# Patient Record
Sex: Female | Born: 1960 | Race: White | Hispanic: No | Smoking: Never smoker
Health system: Southern US, Community
[De-identification: ages and names within clinical notes are randomized; demographics above are authoritative.]

## PROBLEM LIST (undated history)

## (undated) DIAGNOSIS — F32A Depression, unspecified: Secondary | ICD-10-CM

## (undated) DIAGNOSIS — G3184 Mild cognitive impairment, so stated: Secondary | ICD-10-CM

## (undated) DIAGNOSIS — D649 Anemia, unspecified: Secondary | ICD-10-CM

## (undated) DIAGNOSIS — F329 Major depressive disorder, single episode, unspecified: Secondary | ICD-10-CM

## (undated) DIAGNOSIS — M75101 Unspecified rotator cuff tear or rupture of right shoulder, not specified as traumatic: Secondary | ICD-10-CM

## (undated) DIAGNOSIS — R112 Nausea with vomiting, unspecified: Secondary | ICD-10-CM

## (undated) DIAGNOSIS — Z9889 Other specified postprocedural states: Secondary | ICD-10-CM

## (undated) DIAGNOSIS — E7800 Pure hypercholesterolemia, unspecified: Secondary | ICD-10-CM

## (undated) HISTORY — PX: TONSILLECTOMY: SUR1361

## (undated) HISTORY — PX: CHOLECYSTECTOMY: SHX55

## (undated) HISTORY — PX: ENDOMETRIAL ABLATION: SHX621

---

## 2007-02-26 ENCOUNTER — Ambulatory Visit: Payer: Self-pay | Admitting: Nurse Practitioner

## 2007-12-22 IMAGING — MG MAM DGTL SCREENING MAMMO W/CAD
1 series · 6 of 6 positions shown · non-contrast
Comparison: none

REASON FOR EXAM: screening mammo
COMMENTS:

[Series 7480: R CC · right · 6 of 6 slices shown]
[im 1/6]
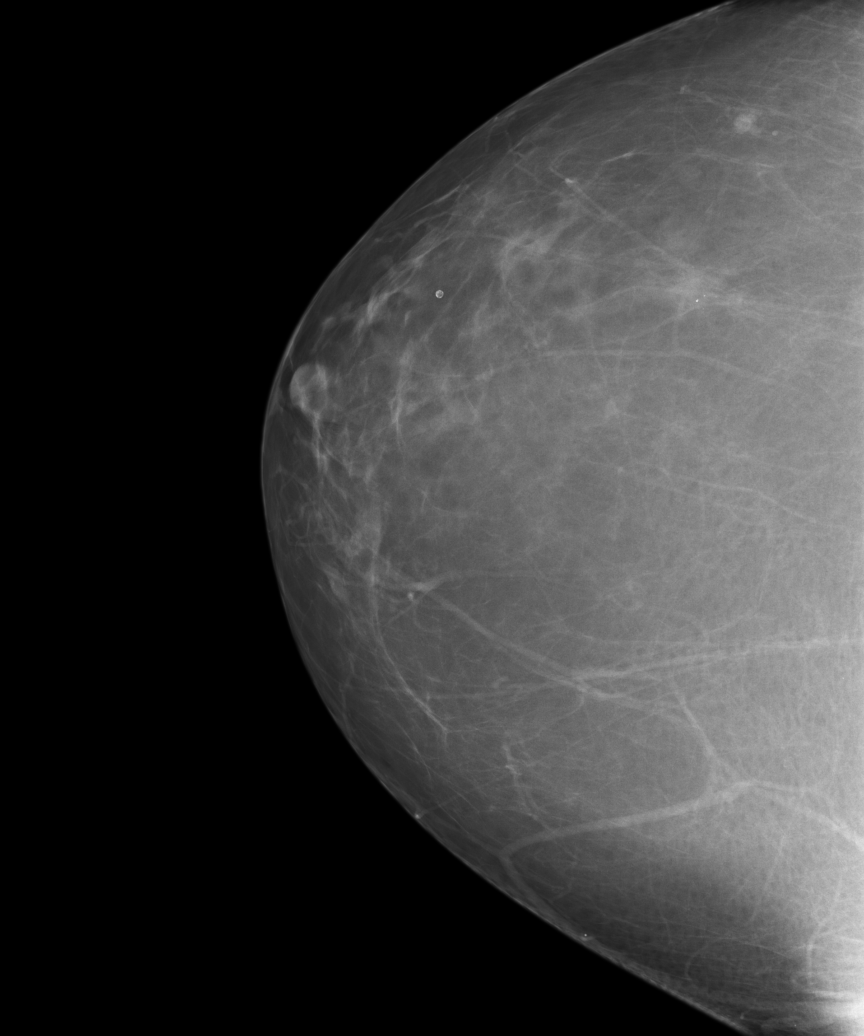
[im 2/6]
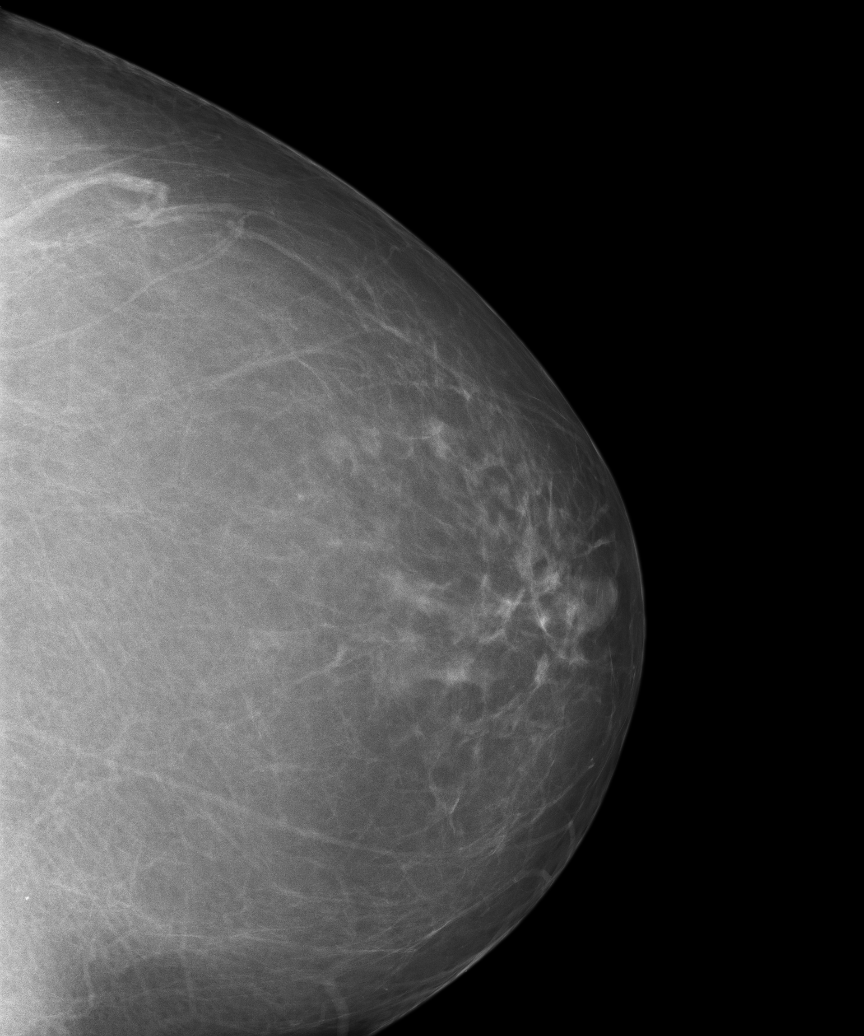
[im 3/6]
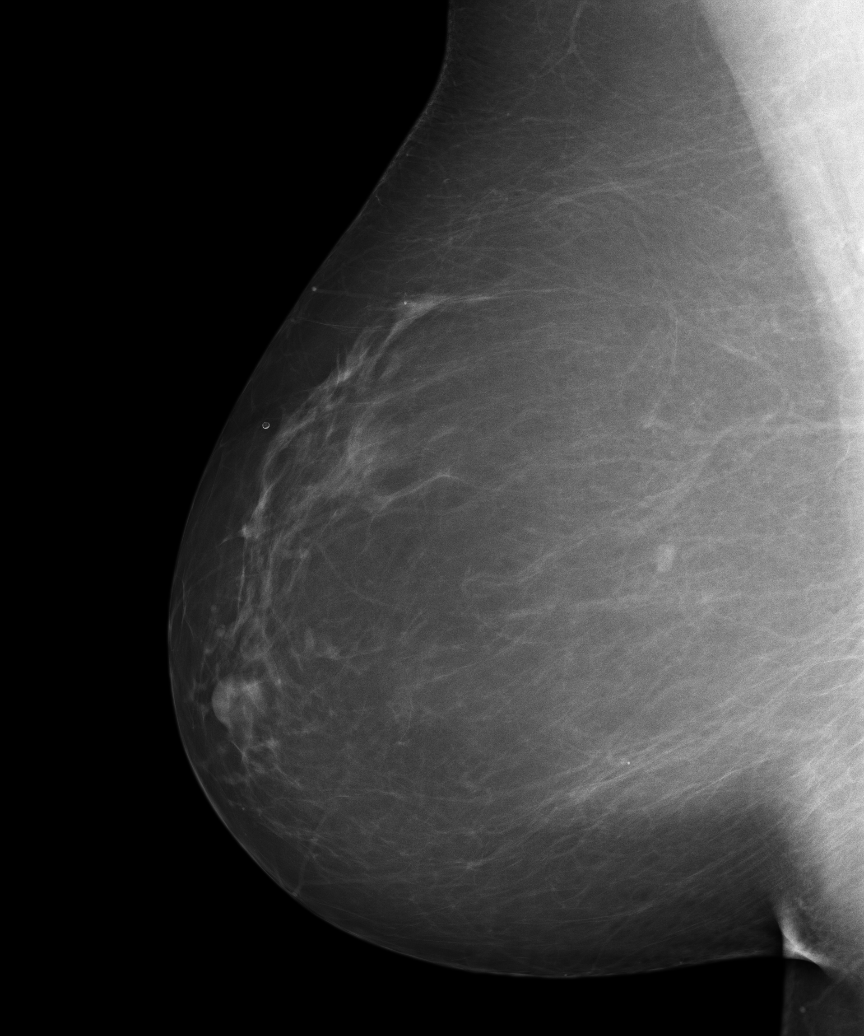
[im 4/6]
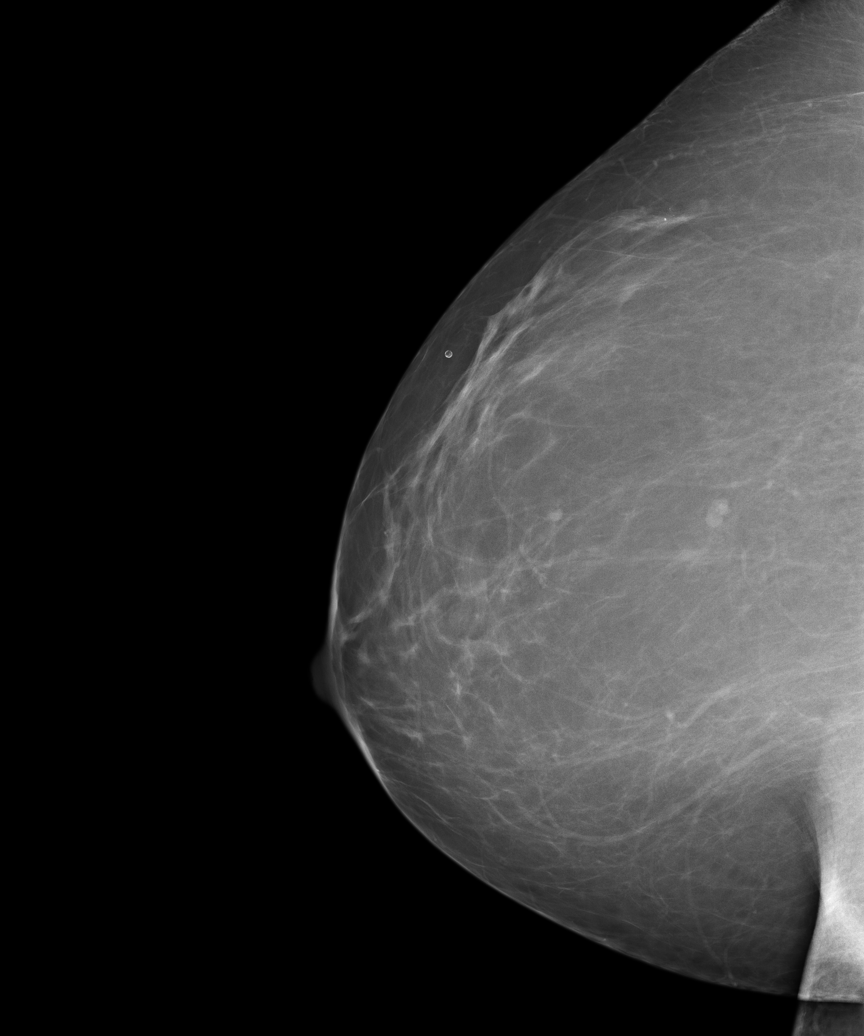
[im 5/6]
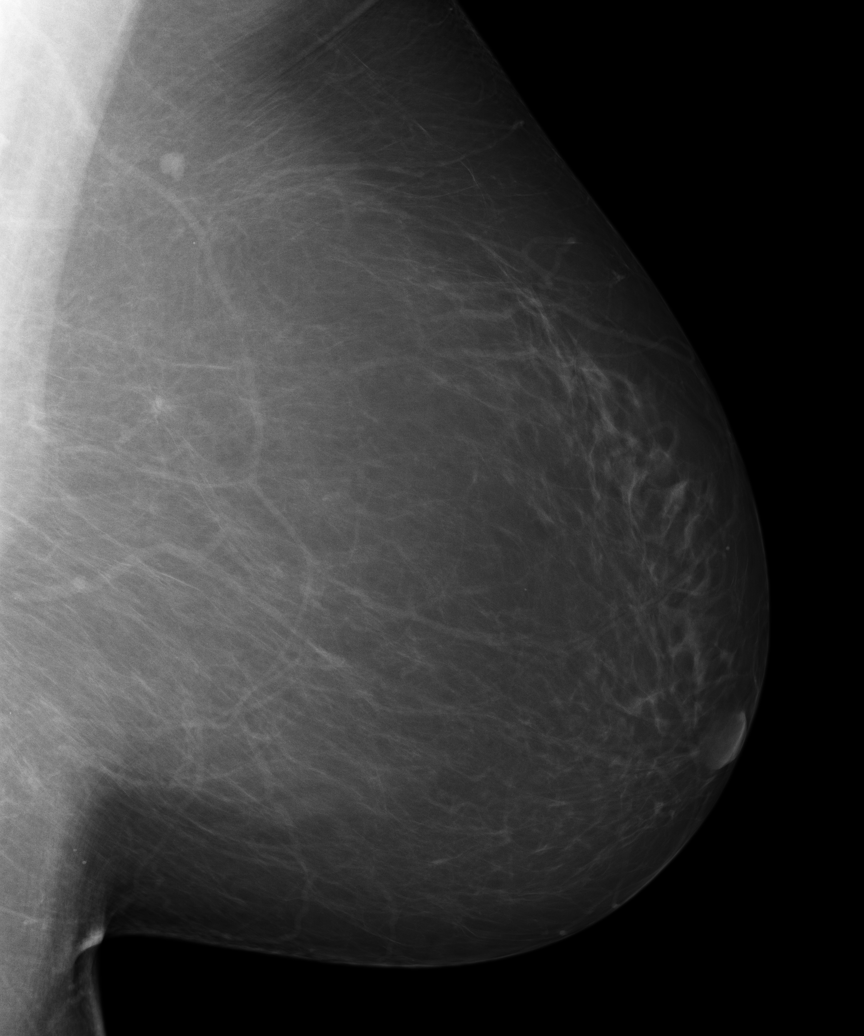
[im 6/6]
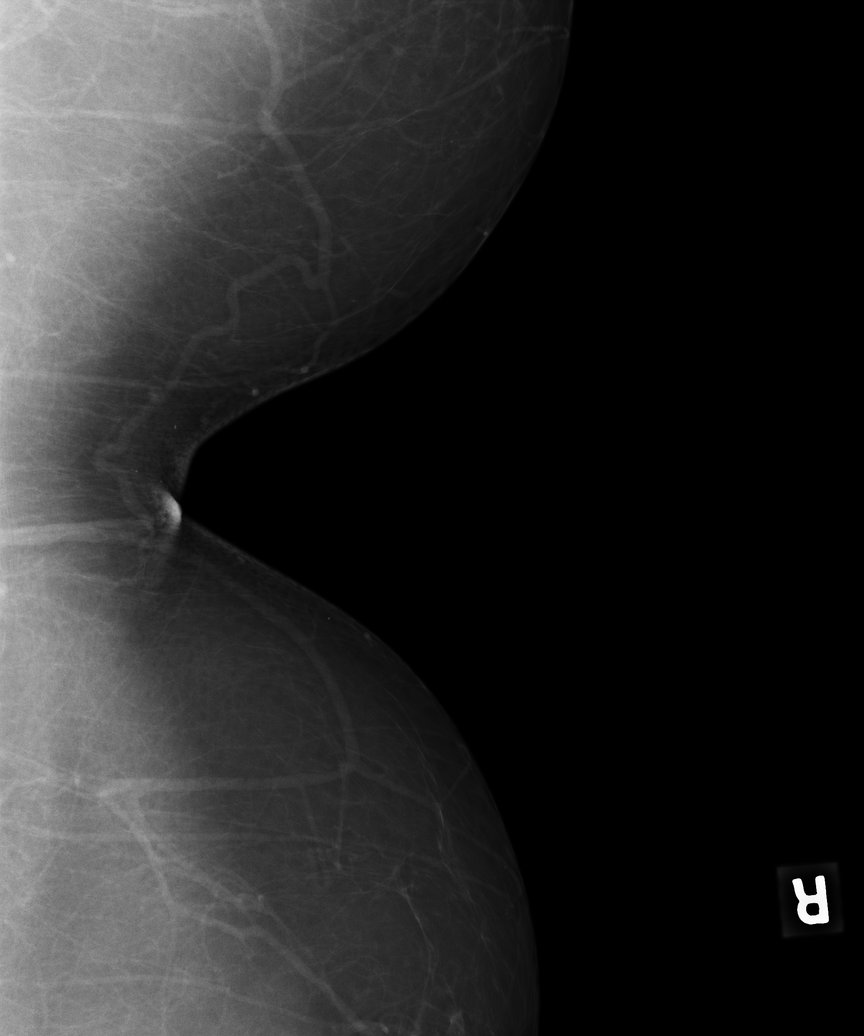

[6 of 6 positions shown; findings below may reference images not displayed]

PROCEDURE:     MAM - MAM DGTL SCREENING MAMMO W/CAD  - February 26, 2007  [DATE]

RESULT:     Comparison is made to prior film screening studies from [REDACTED] [HOSPITAL] dated 18 March, 2002, as well as to a study from
6009.  The 3221 study is incomplete.

The breasts exhibit a moderately dense parenchymal pattern. There is no
dominant mass. Nodules within both breasts are most compatible with benign
intramammary lymph nodes. These appear stable. I see no malignant appearing
grouping of microcalcification.
IMPRESSION: 1)I do not see finding suspicious for malignancy.

BI-RADS: Category 2-Benign Finding

RECOMMENDATIONS

1)Please continue to encourage annual mammographic follow-up.

A NEGATIVE MAMMOGRAM REPORT DOES NOT PRECLUDE BIOPSY OR OTHER EVALUATION OF
A CLINICALLY PALPABLE OR OTHERWISE SUSPICIOUS MASS OR LESION. BREAST CANCER
MAY NOT BE DETECTED BY MAMMOGRAPHY IN UP TO 10% OF CASES.

## 2008-10-17 ENCOUNTER — Ambulatory Visit: Payer: Self-pay | Admitting: Unknown Physician Specialty

## 2009-08-02 ENCOUNTER — Ambulatory Visit: Payer: Self-pay | Admitting: Nurse Practitioner

## 2011-02-14 ENCOUNTER — Ambulatory Visit: Payer: Self-pay | Admitting: Nurse Practitioner

## 2011-12-17 ENCOUNTER — Ambulatory Visit: Payer: Self-pay | Admitting: Internal Medicine

## 2012-04-16 ENCOUNTER — Ambulatory Visit: Payer: Self-pay | Admitting: Nurse Practitioner

## 2012-07-02 ENCOUNTER — Ambulatory Visit: Payer: Self-pay | Admitting: Unknown Physician Specialty

## 2013-02-09 IMAGING — MG MMM DGT SCR NO ORDER W/CAD
1 series · 4 of 4 positions shown · non-contrast
Comparison: none

REASON FOR EXAM: SCR MAMMO NO ORDER
COMMENTS:

PROCEDURE:     MMM - MMM DGT SCR NO ORDER W/CAD  - April 16, 2012  [DATE]
RESULT:      Breast are predominately fatty replaced with slight nodularity.
No mass , no pathologic clustered calcification demonstrated. CAD evaluation
is nonfocal.

[Series 9069: R CC · right · 4 of 4 slices shown]
[im 1/4]
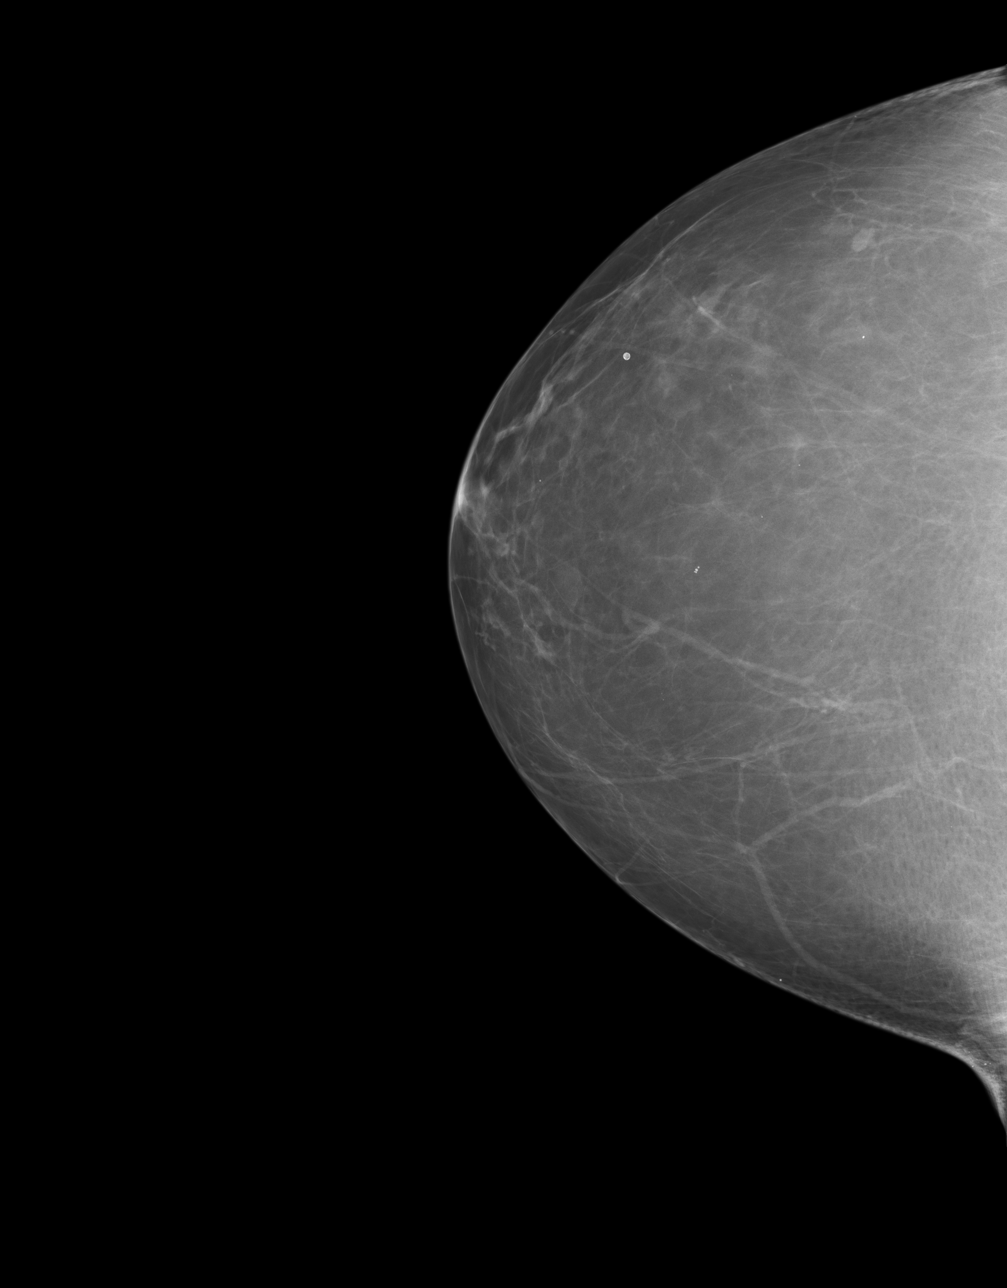
[im 2/4]
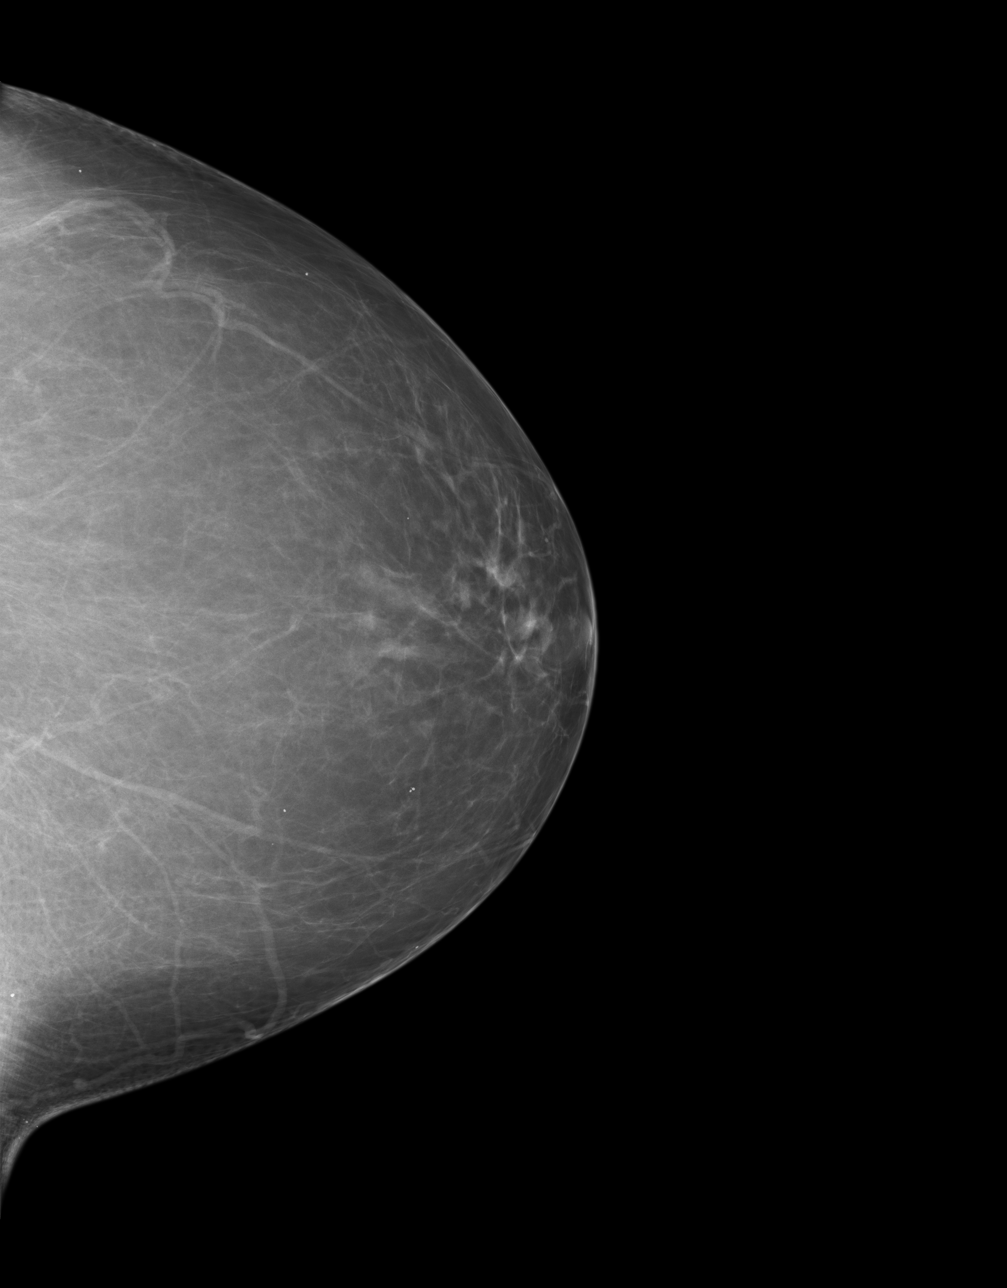
[im 3/4]
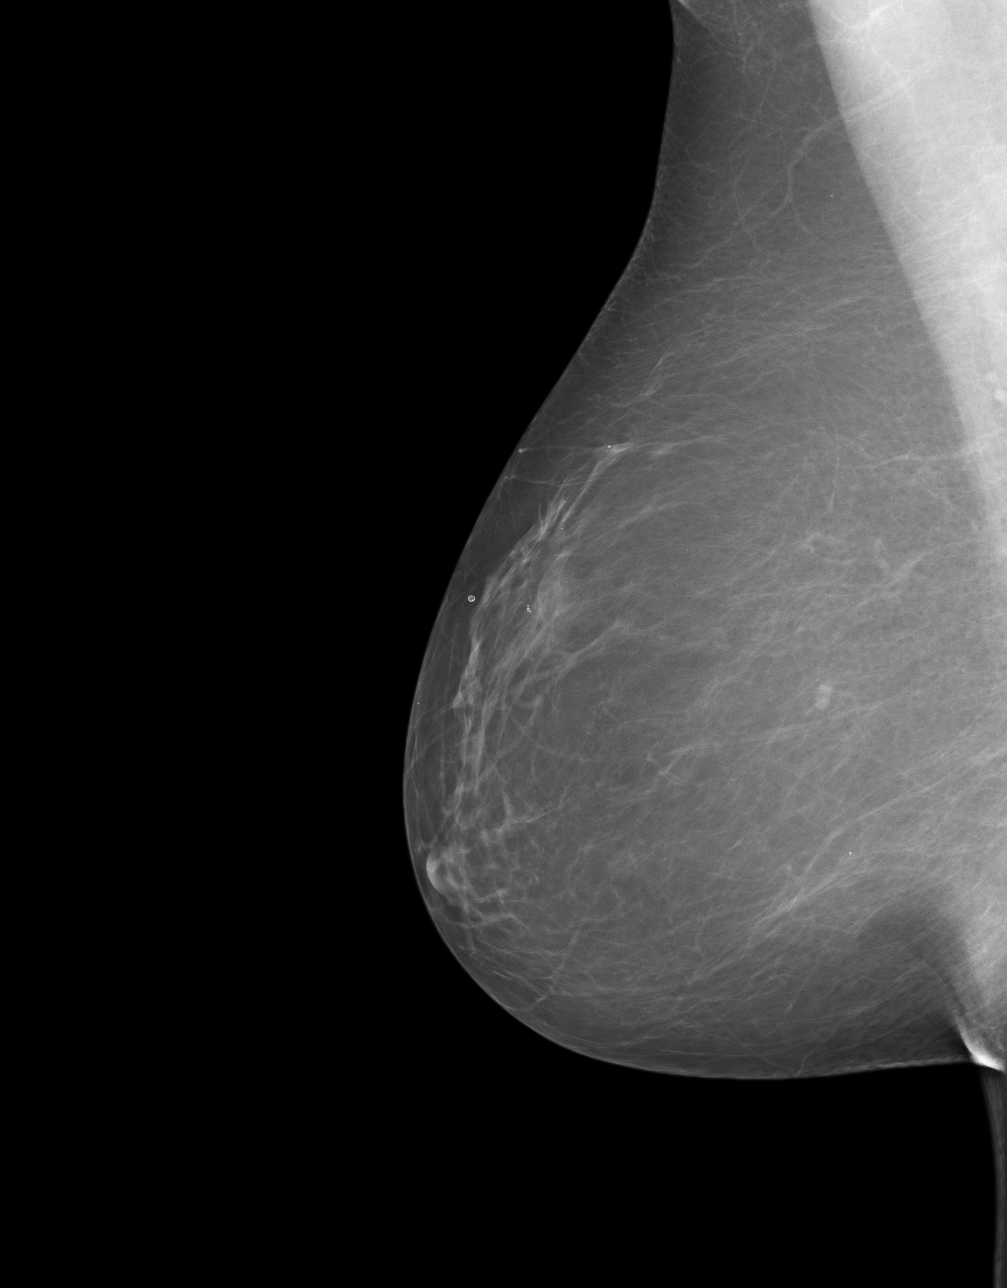
[im 4/4]
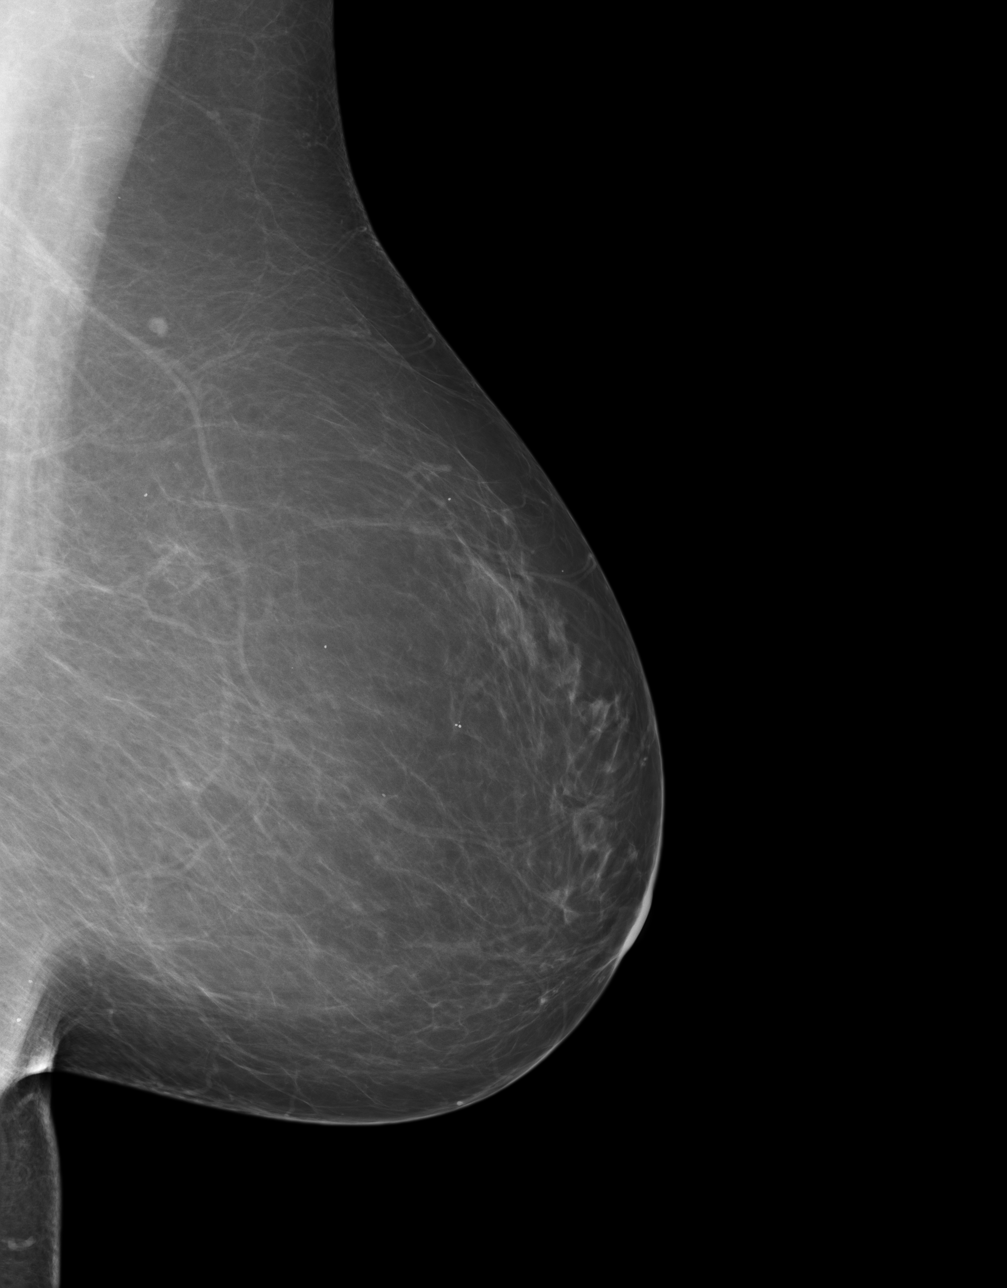

[4 of 4 positions shown; findings below may reference images not displayed]

IMPRESSION: Benign exam.

BI-RADS: Category 2- Benign Finding

A NEGATIVE MAMMOGRAM REPORT DOES NOT PRECLUDE BIOPSY OR OTHER EVALUATION OF
A CLINICALLY PALPABLE OR OTHERWISE SUSPICIOUS MASS OR LESION. BREAST CANCER
MAY NOT BE DETECTED IN UP TO 10% OF CASES.

## 2014-04-14 ENCOUNTER — Ambulatory Visit: Payer: Self-pay | Admitting: Nurse Practitioner

## 2015-05-19 ENCOUNTER — Ambulatory Visit
Admission: EM | Admit: 2015-05-19 | Discharge: 2015-05-19 | Disposition: A | Discharge status: Home / Self Care | Payer: BC Managed Care – PPO | Attending: Internal Medicine | Admitting: Internal Medicine

## 2015-05-19 ENCOUNTER — Encounter: Payer: Self-pay | Admitting: Emergency Medicine

## 2015-05-19 DIAGNOSIS — L309 Dermatitis, unspecified: Secondary | ICD-10-CM

## 2015-05-19 HISTORY — DX: Depression, unspecified: F32.A

## 2015-05-19 HISTORY — DX: Major depressive disorder, single episode, unspecified: F32.9

## 2015-05-19 MED ORDER — HYDROCORTISONE VALERATE 0.2 % EX OINT: 1.0000 "application " | TOPICAL_OINTMENT | Freq: Two times a day (BID) | CUTANEOUS | Status: DC

## 2015-05-19 MED ORDER — FAMCICLOVIR 500 MG PO TABS: 500.0000 mg | ORAL_TABLET | Freq: Three times a day (TID) | ORAL | Status: AC

## 2015-05-19 NOTE — ED Notes (Signed)
Pt with a rash on neck x 1 week

## 2015-05-19 NOTE — Discharge Instructions (Signed)
Rash on neck and patches on arms could be due to a viral infection such as shingles or the cold sore virus, or the rash on the arms in particular could be a contact reaction like poison ivy. Prescriptions for famvir (anti viral agent) and westcort ointment (steroid preparation) [facility].

## 2015-05-20 NOTE — ED Provider Notes (Signed)
CSN: [id number]     Arrival date & time 05/19/15  1639 History   First MD Initiated Contact with Patient 05/19/15 1824     Chief Complaint  Patient presents with  . Rash   HPI 54yo lady with 1wk history itchy/burning red bumps on B forearms, L side of neck.  Some with tiny blisters, some crusted.   No purulent drainage.  Under a lot of stress, 2 funerals in the last week.  No fever, no respiratory symptoms, no GI distress reported.   Wonders about shingles.   Additional past history: seasonal allergies; endometrial ablation 2001 PCP: Feldpausch Family Hx: cancer, Alzheimer's disease   Past Medical History  Diagnosis Date  . Depression    Past Surgical History  Procedure Laterality Date  . Cesarean section      Social History  Substance Use Topics  . Smoking status: Never Smoker   . Smokeless tobacco: None  . Alcohol Use: Yes    Review of Systems  All other systems reviewed and are negative.   Allergies  Review of patient's allergies indicates no known allergies.  Home Medications   Prior to Admission medications   Medication Sig Start Date End Date Taking? Authorizing Provider  aspirin 81 MG chewable tablet Chew by mouth daily.   Yes Historical Provider, MD  cetirizine (ZYRTEC) 10 MG tablet Take 10 mg by mouth daily.   Yes Historical Provider, MD  FLUoxetine (PROZAC) 10 MG capsule Take 10 mg by mouth daily.   Yes Historical Provider, MD  fluticasone (FLONASE) 50 MCG/ACT nasal spray Place into both nostrils daily.   Yes Historical Provider, MD  naproxen sodium (ANAPROX) 220 MG tablet Take 220 mg by mouth 2 (two) times daily with a meal.   Yes Historical Provider, MD  potassium chloride (K-DUR,KLOR-CON) 10 MEQ tablet Take 10 mEq by mouth 2 (two) times daily.   Yes Historical Provider, MD  Vitamin D, Ergocalciferol, (DRISDOL) 50000 UNITS CAPS capsule Take 50,000 Units by mouth every 7 (seven) days.   Yes Historical Provider, MD                  BP 134/71 mmHg  Pulse  66  Temp(Src) 97.8 F (36.6 C) (Tympanic)  Resp 20  Ht 5' 7.5" (1.715 m)  Wt 202 lb (91.627 kg)  BMI 31.15 kg/m2  SpO2 99%   Physical Exam  Constitutional: She is oriented to person, place, and time. No distress.  Alert, nicely groomed  HENT:  Head: Atraumatic.  Eyes:  Conjugate gaze, no eye redness/drainage  Neck: Neck supple.  Cardiovascular: Normal rate.   Pulmonary/Chest: No respiratory distress.  Abdominal: She exhibits no distension.  Musculoskeletal: Normal range of motion.  No leg swelling  Neurological: She is alert and oriented to person, place, and time.  Skin: Skin is warm and dry.  Pink. No cyanosis Clustered red papules L neck, some with tiny vesicles, sore/burning, no crusts/swelling/fluctuance B forearms with similar red papules, less clustered, many crusted.  No surrounding erythema/swelling/fluctuance.  No pustules, none draining.    Nursing note and vitals reviewed.   ED Course  Procedures (including critical care time)  Differential diagnosis includes viral infection: zoster v HSV, favor atypical presentation of HSV, and contact dermatitis/poison ivy type dermatitis. Symptoms persistent for the last week, will give rx for famvir and topical steroid (arms are more itchy than burning).    MDM   1. Dermatitis    As above.  Anticipate gradual improvement over the next 1-2  weeks.   Recheck or FU pcp/Dr Maryjane Hurter or dermatology if not improving as anticipated.  Discharge Medication List as of 05/19/2015  6:33 PM    START taking these medications   Details  famciclovir (FAMVIR) 500 MG tablet Take 1 tablet (500 mg total) by mouth 3 (three) times daily., Starting 05/19/2015, Until Fri 05/26/15, Normal    hydrocortisone valerate ointment (WESTCORT) 0.2 % Apply 1 application topically 2 (two) times daily., Starting 05/19/2015, Until Discontinued, Normal           Eustace Moore, MD 05/20/15 1212

## 2017-06-29 ENCOUNTER — Ambulatory Visit
Admission: EM | Admit: 2017-06-29 | Discharge: 2017-06-29 | Disposition: A | Discharge status: Home / Self Care | Payer: BC Managed Care – PPO | Attending: Family Medicine | Admitting: Family Medicine

## 2017-06-29 ENCOUNTER — Other Ambulatory Visit: Payer: Self-pay

## 2017-06-29 DIAGNOSIS — R05 Cough: Secondary | ICD-10-CM

## 2017-06-29 DIAGNOSIS — J0100 Acute maxillary sinusitis, unspecified: Secondary | ICD-10-CM | POA: Diagnosis not present

## 2017-06-29 DIAGNOSIS — R059 Cough, unspecified: Secondary | ICD-10-CM

## 2017-06-29 MED ORDER — HYDROCOD POLST-CPM POLST ER 10-8 MG/5ML PO SUER: 5.0000 mL | Freq: Every evening | ORAL | 0 refills | Status: DC | PRN

## 2017-06-29 MED ORDER — AMOXICILLIN-POT CLAVULANATE 875-125 MG PO TABS: 1.0000 | ORAL_TABLET | Freq: Two times a day (BID) | ORAL | 0 refills | Status: DC

## 2017-06-29 NOTE — Discharge Instructions (Signed)
Take medication as prescribed. Rest. Drink plenty of fluids.  ° °Follow up with your primary care physician this week as needed. Return to Urgent care for new or worsening concerns.  ° °

## 2017-06-29 NOTE — ED Triage Notes (Signed)
Pt with 3 weeks of cough, sinus congestion, drainage down back of throat and now has sore throat, bilateral ear pressure, facial pain.

## 2017-06-29 NOTE — ED Provider Notes (Signed)
[facility] ____________________________________________  Time seen: Approximately 8:22 AM  I have reviewed the triage vital signs and the nursing notes.   HISTORY  Chief Complaint Facial Pain   HPI Diana Baldwin is a 56 y.o. female presented for evaluation of approximately 3 weeks of runny nose, nasal congestion, sinus pressure, bilateral ear discomfort and some cough.  Denies known accompanying fevers.   states getting a lot of very thick greenish nasal drainage out when blowing her nose.  States cough is mostly dry.  Reports multiple others in her household with similar complaints.  Reports symptoms unresolved with multiple over-the-counter cough and congestion agents.  Reports his continue remain active.  Reports overall continues to eat and drink well.  Denies other aggravating or alleviating factors.  Denies pain at this time. Denies chest pain, shortness of breath, abdominal pain, or rash. Denies recent sickness. Denies recent antibiotic use.   Marina GoodellFeldpausch, Dale E, MD: PCP   Past Medical History:  Diagnosis Date  . Depression     There are no active problems to display for this patient.   Past Surgical History:  Procedure Laterality Date  . CESAREAN SECTION       No current facility-administered medications for this encounter.   Current Outpatient Medications:  .  fexofenadine-pseudoephedrine (ALLEGRA-D) 60-120 MG 12 hr tablet, Take 1 tablet by mouth 2 (two) times daily., Disp: , Rfl:  .  amoxicillin-clavulanate (AUGMENTIN) 875-125 MG tablet, Take 1 tablet by mouth every 12 (twelve) hours., Disp: 20 tablet, Rfl: 0 .  aspirin 81 MG chewable tablet, Chew by mouth daily., Disp: , Rfl:  .  chlorpheniramine-HYDROcodone (TUSSIONEX PENNKINETIC ER) 10-8 MG/5ML SUER, Take 5 mLs by mouth at bedtime as needed. do not drive or operate machinery while taking as can cause drowsiness., Disp: 75 mL, Rfl: 0 .  FLUoxetine (PROZAC) 10 MG capsule, Take 10 mg by mouth  daily., Disp: , Rfl:  .  fluticasone (FLONASE) 50 MCG/ACT nasal spray, Place into both nostrils daily., Disp: , Rfl:  .  potassium chloride (K-DUR,KLOR-CON) 10 MEQ tablet, Take 10 mEq by mouth 2 (two) times daily., Disp: , Rfl:  .  Vitamin D, Ergocalciferol, (DRISDOL) 50000 UNITS CAPS capsule, Take 50,000 Units by mouth every 7 (seven) days., Disp: , Rfl:   Allergies Patient has no known allergies.  Family History  Problem Relation Age of Onset  . Cancer Mother   . Alzheimer's disease Mother   . Transient ischemic attack Mother   . Cancer Father   . Alzheimer's disease Father     Social History Social History   Tobacco Use  . Smoking status: Never Smoker  . Smokeless tobacco: Never Used  Substance Use Topics  . Alcohol use: Yes    Comment: social  . Drug use: No    Review of Systems Constitutional: No fever/chills ENT: States some sore throat.  Cardiovascular: Denies chest pain. Respiratory: Denies shortness of breath. Gastrointestinal: No abdominal pain.   Musculoskeletal: Negative for back pain. Skin: Negative for rash.  ____________________________________________   PHYSICAL EXAM:  VITAL SIGNS: ED Triage Vitals [06/29/17 0813]  Enc Vitals Group     BP (!) 155/75     Pulse Rate 80     Resp 18     Temp 98.3 F (36.8 C)     Temp Source Oral     SpO2 97 %     Weight 210 lb (95.3 kg)     Height 5\' 8"  (1.727 m)  Head Circumference      Peak Flow      Pain Score 6     Pain Loc      Pain Edu?      Excl. in GC?     Constitutional: Alert and oriented. Well appearing and in no acute distress. Eyes: Conjunctivae are normal.  Head: Atraumatic.Mild to moderate tenderness to palpation bilateral maxillary sinuses.  Nontender frontal sinuses.  No swelling. No erythema.   Ears: no erythema, normal TMs bilaterally.   Nose: nasal congestion with bilateral nasal turbinate erythema and edema.   Mouth/Throat: Mucous membranes are moist.  Oropharynx  non-erythematous.No tonsillar swelling or exudate.  Neck: No stridor.  No cervical spine tenderness to palpation. Hematological/Lymphatic/Immunilogical: No cervical lymphadenopathy. Cardiovascular: Normal rate, regular rhythm. Grossly normal heart sounds.  Good peripheral circulation. Respiratory: Normal respiratory effort.  No retractions. No wheezes, rales or rhonchi. Good air movement.  Musculoskeletal: Steady gait.  Neurologic:  Normal speech and language. No gait instability. Skin:  Skin is warm, dry and intact. No rash noted. Psychiatric: Mood and affect are normal. Speech and behavior are normal.  ___________________________________________   LABS (all labs ordered are listed, but only abnormal results are displayed)  Labs Reviewed - No data to display ____________________________________________  PROCEDURES Procedures   INITIAL IMPRESSION / ASSESSMENT AND PLAN / ED COURSE  Pertinent labs & imaging results that were available during my care of the patient were reviewed by me and considered in my medical decision making (see chart for details).  Well-appearing patient.  No acute distress.  Suspect maxillary sinusitis and continued cough.  Treat patient with oral Augmentin and as needed Tussionex.  Encourage rest, fluids, supportive care. Discussed indication, risks and benefits of medications with patient.  Discussed follow up with Primary care physician this week. Discussed follow up and return parameters including no resolution or any worsening concerns. Patient verbalized understanding and agreed to plan.   ____________________________________________   FINAL CLINICAL IMPRESSION(S) / ED DIAGNOSES  Final diagnoses:  Acute maxillary sinusitis, recurrence not specified  Cough     ED Discharge Orders        Ordered    amoxicillin-clavulanate (AUGMENTIN) 875-125 MG tablet  Every 12 hours     06/29/17 0830    chlorpheniramine-HYDROcodone (TUSSIONEX PENNKINETIC ER) 10-8  MG/5ML SUER  At bedtime PRN     06/29/17 0830       Note: This dictation was prepared with Dragon dictation along with smaller phrase technology. Any transcriptional errors that result from this process are unintentional.         Renford DillsMiller, Kendel Bessey, NP 06/29/17 [phone]

## 2020-02-24 ENCOUNTER — Other Ambulatory Visit: Payer: Self-pay | Admitting: Family Medicine

## 2020-02-24 DIAGNOSIS — Z1231 Encounter for screening mammogram for malignant neoplasm of breast: Secondary | ICD-10-CM

## 2021-06-05 ENCOUNTER — Ambulatory Visit: Payer: BC Managed Care – PPO | Attending: Family Medicine

## 2021-06-05 ENCOUNTER — Other Ambulatory Visit: Payer: Self-pay

## 2021-06-05 DIAGNOSIS — H8112 Benign paroxysmal vertigo, left ear: Secondary | ICD-10-CM | POA: Diagnosis present

## 2021-06-05 DIAGNOSIS — R42 Dizziness and giddiness: Secondary | ICD-10-CM | POA: Diagnosis present

## 2021-06-05 NOTE — Therapy (Signed)
[facility] [address] [place], [place], [address] Phone: [phone]   Fax:  [phone]  Physical Therapy Evaluation  Patient Details  Name: Diana Baldwin MRN: [id number] Date of Birth: 06-07-1961 Referring Provider (PT): Dr. Jolayne Haines  Encounter Date: 06/05/2021   PT End of Session - 06/06/21 0925     Visit Number 1    Number of Visits 7    Date for PT Re-Evaluation 07/17/21    Authorization Type eval: 06/05/21, VL: based on [place]  Deductible: $1250/$1250 remains  co-ins: 20%  OOP: [id number] remains  no auth req    PT Start Time 1720    PT Stop Time 1800    PT Time Calculation (min) 40 min    Activity Tolerance Patient tolerated treatment well    Behavior During Therapy [facility] for tasks assessed/performed             Past Medical History:  Diagnosis Date   Depression     Past Surgical History:  Procedure Laterality Date   CESAREAN SECTION      There were no vitals filed for this visit.    Subjective Assessment - 06/06/21 0919     Subjective Dizziness    Patient is accompained by: Family member   Daughter and granddaughter (infant)   Pertinent History Pt reports that three years ago she started with vertigo and was treated successfully with the Epley maneuver. She is unable to recall which canal/side was treated. About three weeks ago pt had a filling at the dentist and was laying with her head down for an extended period of time. She has been experiencing vertigo since that time and it has worsened over the last week. Aggravating factors are rolling in bed, bending over, and turning her head quickly. Symptoms last for less than one minute and the only easing factor is waiting for symptoms to pass. Otherwise she denies any recent changes in health/medications. She reports some intermittent twitching of the muscle under her left eye. ROS is negative for red flags.    Diagnostic tests None reported    Patient  Stated Goals Resolve vertigo    Currently in Pain? Other (Comment)   Unrelated to current episode               [facility] PT Assessment - 06/06/21 7096       Assessment   Medical Diagnosis Vertigo    Referring Provider (PT) Dr. Jolayne Haines    Onset Date/Surgical Date 05/15/21    Hand Dominance Right    Next MD Visit None reported    Prior Therapy Previously treated with Epley manuever      Precautions   Precautions None      Restrictions   Weight Bearing Restrictions No      Balance Screen   Has the patient fallen in the past 6 months No      Home Environment   Living Environment Private residence      Prior Function   Level of Independence Independent    Vocation Full time employment    Forensic psychologist      Cognition   Overall Cognitive Status Within Functional Limits for tasks assessed                VESTIBULAR AND BALANCE EVALUATION   HISTORY:  Subjective history of current problem: Pt reports that three years ago she started with vertigo and was treated successfully with the Epley maneuver.  She is unable to recall which canal/side was treated. About three weeks ago pt had a filling at the dentist and was laying with her head down for an extended period of time. She has been experiencing vertigo since that time and it has worsened over the last week. Aggravating factors are rolling in bed, bending over, and turning her head quickly. Symptoms last for less than one minute and the only easing factor is waiting for symptoms to pass. Otherwise she denies any recent changes in health/medications. She reports some intermittent twitching of the muscle under her left eye. ROS is negative for red flags.  Description of dizziness: vertigo (vertigo, unsteadiness, lightheadedness, falling, general unsteadiness, whoozy, swimmy-headed sensation, aural fullness) Frequency: Daily Duration: less than 1 minute; Symptom nature: motion provoked  (motion provoked, positional, spontaneous, constant, variable, intermittent)   Provocative Factors: rolling, bending over, quick head motions Easing Factors: waiting for symptoms to pass  Progression of symptoms: worse over the last week (better, worse, no change since onset) History of similar episodes: Previously treated for BPPV with Epley manuever  Falls (yes/no): No Number of falls in past 6 months:   Prior Functional Level: Independent with ADLs/IADLs, Special education teacher at [employer] complaints (tinnitus, pain, drainage, hearing loss, aural fullness): bilateral tinnitus for years, bilateral hearing aids, otherwise denies Vision (diplopia, visual field loss, recent changes, last eye exam): Prescription glasses but otherwise no vision changes Headaches/migraines: history of headaches, diagnosed previously with migraines (not currently being treated) Chest pain: No Stress/anxiety: stressful job Chief Strategy Officer, understaffed);  Red Flags: (dysarthria, dysphagia, drop attacks, bowel and bladder changes, recent weight loss/gain) Review of systems negative for red flags.     EXAMINATION  POSTURE: Grossly WNL  NEUROLOGICAL SCREEN: (2+ unless otherwise noted.) N=normal  Ab=abnormal  Level Dermatome R L Myotome R L Reflex R L  C3 Anterior Neck N N Sidebend C2-3 N N Jaw CN V    C4 Top of Shoulder N N Shoulder Shrug C4 N N Hoffman's UMN    C5 Lateral Upper Arm N N Shoulder ABD C4-5 N N Biceps C5-6    C6 Lateral Arm/ Thumb N N Arm Flex/ Wrist Ext C5-6 N N Brachiorad. C5-6    C7 Middle Finger N N Arm Ext//Wrist Flex C6-7 N N Triceps C7    C8 4th & 5th Finger N N Flex/ Ext Carpi Ulnaris C8 N N Patellar (L3-4)    T1 Medial Arm N N Interossei T1 N N Gastrocnemius    L2 Medial thigh/groin N N Illiopsoas (L2-3) N N     L3 Lower thigh/med.knee N N Quadriceps (L3-4) N N     L4 Medial leg/lat thigh N N Tibialis Ant (L4-5) N N     L5 Lat. leg & dorsal foot N N  EHL (L5) N N     S1 post/lat foot/thigh/leg N N Gastrocnemius (S1-2) N N     S2 Post./med. thigh & leg N N Hamstrings (L4-S3) N N       Cranial Nerves Visual acuity and visual fields are intact  Extraocular muscles are intact  Facial sensation is intact bilaterally  Facial strength is intact bilaterally  Hearing is normal as tested by gross conversation Palate elevates midline, normal phonation  Shoulder shrug strength is intact  Tongue protrudes midline     COORDINATION: Finger to Nose: Normal Heel to Shin: Normal Pronator Drift: Negative Rapid Alternating Movements: Normal Finger to Thumb Opposition: Normal  MUSCULOSKELETAL SCREEN: Cervical Spine  ROM: WFL and painless in all planes. No gross deficits identified   ROM: WNL  MMT: WNL  Functional Mobility: Independent for transfers and ambulation without assistive device   Gait: No gross deficits identified   POSTURAL CONTROL TESTS:   Clinical Test of Sensory Interaction for Balance    (CTSIB): Deferred   OCULOMOTOR / VESTIBULAR TESTING:   Oculomotor Exam- Room Light: Deferred  Oculomotor Exam- Fixation Suppressed: Deferred  BPPV TESTS:  Symptoms Duration Intensity Nystagmus  L Dix-Hallpike Vertigo Approximately 15 seconds Moderate to severe Upbeating L torsional  R Dix-Hallpike None   None  L Head Roll NT   None  R Head Roll NT   None  L Sidelying Test      R Sidelying Test        FUNCTIONAL OUTCOME MEASURES: Deferred  FOTO: 48, predicted improvement to 61                        Objective measurements completed on examination: See above findings.       Canalith Repositioning Treatment Pt treated with 3 bouts of modified Epley Maneuver for presumed L posterior canal BPPV. One minute holds in each position and retesting between maneuvers. After first maneuver reports very faint vertigo and has a couple faints beats of upbeating L torsional nystagmus. After second maneuver pt denies  any vertigo and has no further nystagmus. Pt and daughter educated about how to perform modified Epley maneuver at home with handout provided and practice during session with daugther.         PT Education - 06/06/21 0924     Education Details Plan of care, BPPV, modified Epley manuever    Person(s) Educated Patient;Child(ren)    Methods Explanation;Demonstration;Handout    Comprehension Verbalized understanding;Returned demonstration              PT Short Term Goals - 06/06/21 0936       PT SHORT TERM GOAL #1   Title Pt will be independent with HEP to treat vertigo in order to improve symtom free function at work and decrease her fall risk.    Time 3    Period Weeks    Status New    Target Date 06/26/21               PT Long Term Goals - 06/06/21 0937       PT LONG TERM GOAL #1   Title Pt will report no further episodes fo vertigo when rolling in bend, bending over, or turning her head quickly    Time 6    Period Weeks    Status New    Target Date 07/17/21      PT LONG TERM GOAL #2   Title Pt will increase FOTO to at least 61 in order to demonstrate significant improvement in function related to vertigo    Baseline 06/05/21: 48    Time 6    Period Weeks    Status New    Target Date 07/17/21                    Plan - 06/06/21 0933     Clinical Impression Statement Pt is a pleasant 60 year-old female referred for vertigo. Positive L Dix-Hallpike test which is consistent with L posterior canal BPPV. Pt treated with 3 rounds of the modified L Epley maneuver with resolution of her nystagmus and vertigo after the final manuever. Education with patient and  daughter regarding how to perform modified Epley maneuver at the home. Will continue to test and treat at follow-up sessions until pt achieves full resolution of symptoms. Pt will benefit from skilled PT services to address deficits in vertigo in order to decrease symptoms and decrease her risk for  falls.    Personal Factors and Comorbidities Past/Current Experience    Examination-Activity Limitations Bend;Bed Mobility    Examination-Participation Restrictions Community Activity;Other   Rolling in bed   Stability/Clinical Decision Making Stable/Uncomplicated    Clinical Decision Making Low    Rehab Potential Excellent    PT Frequency 1x / week    PT Duration 6 weeks    PT Treatment/Interventions Therapeutic activities;ADLs/Self Care Home Management;Aquatic Therapy;Biofeedback;Canalith Repostioning;Electrical Stimulation;Iontophoresis 4mg /ml Dexamethasone;Cryotherapy;Moist Heat;Traction;Ultrasound;DME Instruction;Gait training;Stair training;Functional mobility training;Neuromuscular re-education;Balance training;Therapeutic exercise;Patient/family education;Manual techniques;Passive range of motion;Dry needling;Vestibular;Spinal Manipulations;Joint Manipulations    PT Next Visit Plan Repeat Dix-hallpike at treat with CRT as apporopriate, once clear perform additional testing if indicated    PT Home Exercise Plan Access Code: [email or web address]    Consulted and Agree with Plan of Care Patient;Family member/caregiver    Family Member Consulted Daughter             Patient will benefit from skilled therapeutic intervention in order to improve the following deficits and impairments:  Dizziness  Visit Diagnosis: Dizziness and giddiness  BPPV (benign paroxysmal positional vertigo), left     Problem List There are no problems to display for this patient.  Lynnea Maizes PT, DPT, GCS  Buell Parcel, PT 06/06/2021, 9:41 AM  [place] [facility] [address] [place], [place], [address] Phone: [phone]   Fax:  [phone]  Name: Diana Baldwin MRN: [id number] Date of Birth: [birth year]

## 2021-06-05 NOTE — Patient Instructions (Signed)
Access Code: [email or web address] URL: [email or web address] Date: 06/05/2021 Prepared by: Ria Comment  Exercises Self-Epley Maneuver Left Ear - 1 x daily - 7 x weekly - 3 reps - 60s in each position hold  Patient Education BPPV BPPV

## 2021-06-12 ENCOUNTER — Ambulatory Visit: Payer: BC Managed Care – PPO

## 2021-06-15 ENCOUNTER — Ambulatory Visit: Payer: BC Managed Care – PPO

## 2021-06-19 ENCOUNTER — Ambulatory Visit: Payer: BC Managed Care – PPO

## 2021-06-21 ENCOUNTER — Ambulatory Visit: Payer: BC Managed Care – PPO

## 2021-06-26 ENCOUNTER — Ambulatory Visit: Payer: BC Managed Care – PPO

## 2021-07-05 ENCOUNTER — Ambulatory Visit: Payer: BC Managed Care – PPO

## 2021-07-10 ENCOUNTER — Ambulatory Visit: Payer: BC Managed Care – PPO

## 2021-07-13 ENCOUNTER — Ambulatory Visit: Payer: BC Managed Care – PPO

## 2021-07-17 ENCOUNTER — Ambulatory Visit: Payer: BC Managed Care – PPO

## 2021-07-19 ENCOUNTER — Ambulatory Visit: Payer: BC Managed Care – PPO

## 2021-08-26 ENCOUNTER — Ambulatory Visit
Admission: EM | Admit: 2021-08-26 | Discharge: 2021-08-26 | Disposition: A | Discharge status: Home / Self Care | Payer: BC Managed Care – PPO | Attending: Emergency Medicine | Admitting: Emergency Medicine

## 2021-08-26 ENCOUNTER — Other Ambulatory Visit: Payer: Self-pay

## 2021-08-26 DIAGNOSIS — N390 Urinary tract infection, site not specified: Secondary | ICD-10-CM | POA: Diagnosis not present

## 2021-08-26 DIAGNOSIS — R319 Hematuria, unspecified: Secondary | ICD-10-CM | POA: Diagnosis not present

## 2021-08-26 LAB — URINALYSIS, ROUTINE W REFLEX MICROSCOPIC
Bilirubin Urine: NEGATIVE
Glucose, UA: NEGATIVE mg/dL
Ketones, ur: NEGATIVE mg/dL
Nitrite: NEGATIVE
Protein, ur: NEGATIVE mg/dL
Specific Gravity, Urine: 1.01 (ref 1.005–1.030)
pH: 5.5 (ref 5.0–8.0)

## 2021-08-26 LAB — URINALYSIS, MICROSCOPIC (REFLEX)

## 2021-08-26 MED ORDER — CEPHALEXIN 500 MG PO CAPS: 500.0000 mg | ORAL_CAPSULE | Freq: Two times a day (BID) | ORAL | 0 refills | Status: DC

## 2021-08-26 MED ORDER — IBUPROFEN 600 MG PO TABS: 600.0000 mg | ORAL_TABLET | Freq: Four times a day (QID) | ORAL | 0 refills | Status: DC | PRN

## 2021-08-26 MED ORDER — PHENAZOPYRIDINE HCL 200 MG PO TABS: 200.0000 mg | ORAL_TABLET | Freq: Three times a day (TID) | ORAL | 0 refills | Status: DC | PRN

## 2021-08-26 NOTE — ED Triage Notes (Signed)
Pt here with C/O frequency with urination, left flank pain.

## 2021-08-26 NOTE — Discharge Instructions (Addendum)
Push lots of fluids to help flush your kidneys out.  Pyridium will turn your urine orange, but also will help with your symptoms.  Finish the Keflex, even if you feel better.  We will contact you if your urine culture comes back and we need to change your antibiotics.  May take 600 mg of ibuprofen combined with 1000 mg of Tylenol 3-4 times a day as needed for pain.

## 2021-08-26 NOTE — ED Provider Notes (Signed)
HPI  SUBJECTIVE:  Diana Baldwin is a 61 y.o. female who presents with 1 week of difficulty emptying her bladder with urinary urgency, frequency, cloudy, dark urine.  She states she feels as if she needs to force her urinary stream.  She reports intermittent, hours long, dull, achy bilateral back and flank pain with no aggravating or alleviating factors.  No dysuria, odorous urine, hematuria, pelvic pain, nausea, vomiting, fevers, vaginal odor, bleeding, rash, discharge.  She has not been sexually active in 20 years.  She took a Tylenol with codeine last night and tried cranberry juice.  This helped.  No aggravating factors.  She has a past medical history of urinary incontinence.  No history of UTI, pyelonephritis, nephrolithiasis, diabetes, hypertension, chronic kidney disease, BV, yeast.  PMD: [facility].   Past Medical History:  Diagnosis Date   Depression     Past Surgical History:  Procedure Laterality Date   CESAREAN SECTION      Family History  Problem Relation Age of Onset   Cancer Mother    Alzheimer's disease Mother    Transient ischemic attack Mother    Cancer Father    Alzheimer's disease Father     Social History   Tobacco Use   Smoking status: Never   Smokeless tobacco: Never  Vaping Use   Vaping Use: Never used  Substance Use Topics   Alcohol use: Yes    Comment: social   Drug use: No    No current facility-administered medications for this encounter.  Current Outpatient Medications:    cephALEXin (KEFLEX) 500 MG capsule, Take 1 capsule (500 mg total) by mouth 2 (two) times daily., Disp: 14 capsule, Rfl: 0   fexofenadine-pseudoephedrine (ALLEGRA-D) 60-120 MG 12 hr tablet, Take 1 tablet by mouth 2 (two) times daily., Disp: , Rfl:    FLUoxetine (PROZAC) 10 MG capsule, Take 10 mg by mouth daily., Disp: , Rfl:    fluticasone (FLONASE) 50 MCG/ACT nasal spray, Place into both nostrils daily., Disp: , Rfl:    ibuprofen (ADVIL) 600 MG tablet, Take 1  tablet (600 mg total) by mouth every 6 (six) hours as needed., Disp: 30 tablet, Rfl: 0   phenazopyridine (PYRIDIUM) 200 MG tablet, Take 1 tablet (200 mg total) by mouth 3 (three) times daily as needed for pain., Disp: 6 tablet, Rfl: 0   potassium chloride (K-DUR,KLOR-CON) 10 MEQ tablet, Take 10 mEq by mouth 2 (two) times daily., Disp: , Rfl:    rosuvastatin (CRESTOR) 5 MG tablet, TAKE (1) TABLET BY MOUTH EVERY DAY, Disp: , Rfl:    Vitamin D, Ergocalciferol, (DRISDOL) 50000 UNITS CAPS capsule, Take 50,000 Units by mouth every 7 (seven) days., Disp: , Rfl:    aspirin 81 MG chewable tablet, Chew by mouth daily., Disp: , Rfl:   No Known Allergies   ROS  As noted in HPI.   Physical Exam  BP (!) 132/59 (BP Location: Left Arm)    Pulse 64    Temp 98.2 F (36.8 C) (Oral)    Resp 18    Ht 5\' 8"  (1.727 m)    Wt 79.8 kg    SpO2 100%    BMI 26.76 kg/m   Constitutional: Well developed, well nourished, no acute distress Eyes:  EOMI, conjunctiva normal bilaterally HENT: Normocephalic, atraumatic,mucus membranes moist Respiratory: Normal inspiratory effort Cardiovascular: Normal rate GI: nondistended.  Soft, nontender.  No suprapubic tenderness although she reports "pressure" with palpation.  No flank tenderness Back: No CVAT skin: No rash,  skin intact Musculoskeletal: no deformities Neurologic: Alert & oriented x 3, no focal neuro deficits Psychiatric: Speech and behavior appropriate   ED Course   Medications - No data to display  Orders Placed This Encounter  Procedures   Urine Culture    Standing Status:   Standing    Number of Occurrences:   1    Order Specific Question:   Indication    Answer:   Dysuria   Urinalysis, Routine w reflex microscopic    Standing Status:   Standing    Number of Occurrences:   1   Urinalysis, Microscopic (reflex)    Standing Status:   Standing    Number of Occurrences:   1    Results for orders placed or performed during the hospital encounter of  08/26/21 (from the past 24 hour(s))  Urinalysis, Routine w reflex microscopic     Status: Abnormal   Collection Time: 08/26/21  8:30 AM  Result Value Ref Range   Color, Urine YELLOW YELLOW   APPearance CLEAR CLEAR   Specific Gravity, Urine 1.010 1.005 - 1.030   pH 5.5 5.0 - 8.0   Glucose, UA NEGATIVE NEGATIVE mg/dL   Hgb urine dipstick TRACE (A) NEGATIVE   Bilirubin Urine NEGATIVE NEGATIVE   Ketones, ur NEGATIVE NEGATIVE mg/dL   Protein, ur NEGATIVE NEGATIVE mg/dL   Nitrite NEGATIVE NEGATIVE   Leukocytes,Ua TRACE (A) NEGATIVE  Urinalysis, Microscopic (reflex)     Status: Abnormal   Collection Time: 08/26/21  8:30 AM  Result Value Ref Range   RBC / HPF 0-5 0 - 5 RBC/hpf   WBC, UA 0-5 0 - 5 WBC/hpf   Bacteria, UA FEW (A) NONE SEEN   Squamous Epithelial / LPF 0-5 0 - 5   No results found.  ED Clinical Impression  1. Urinary tract infection with hematuria, site unspecified      ED Assessment/Plan  No previous urine cultures available.  Presentation most consistent with a UTI.  No evidence of pyelonephritis at this time with normal vitals, absence of flank or CVA tenderness.  Doubt nephrolithiasis.  Home with Pyridium, Keflex, push fluids.  Sending urine off for culture to confirm antibiotic choice.  Follow-up with PCP as needed, ER return precautions given.  Discussed labs,  MDM, treatment plan, and plan for follow-up with patient. Discussed sn/sx that should prompt return to the ED. patient agrees with plan.   Meds ordered this encounter  Medications   phenazopyridine (PYRIDIUM) 200 MG tablet    Sig: Take 1 tablet (200 mg total) by mouth 3 (three) times daily as needed for pain.    Dispense:  6 tablet    Refill:  0   cephALEXin (KEFLEX) 500 MG capsule    Sig: Take 1 capsule (500 mg total) by mouth 2 (two) times daily.    Dispense:  14 capsule    Refill:  0   ibuprofen (ADVIL) 600 MG tablet    Sig: Take 1 tablet (600 mg total) by mouth every 6 (six) hours as needed.     Dispense:  30 tablet    Refill:  0      *This clinic note was created using Lobby[employer]. Therefore, there may be occasional mistakes despite careful proofreading.  ?    Melynda Ripple, MD 08/26/21 [phone]

## 2021-08-27 LAB — URINE CULTURE: Culture: <10000 — AB

## 2022-01-18 ENCOUNTER — Other Ambulatory Visit: Payer: Self-pay | Admitting: Family Medicine

## 2022-01-18 DIAGNOSIS — Z1231 Encounter for screening mammogram for malignant neoplasm of breast: Secondary | ICD-10-CM

## 2022-02-27 ENCOUNTER — Ambulatory Visit
Admission: RE | Admit: 2022-02-27 | Discharge: 2022-02-27 | Disposition: A | Discharge status: Home / Self Care | Payer: BC Managed Care – PPO | Source: Ambulatory Visit | Attending: Family Medicine | Admitting: Family Medicine

## 2022-02-27 DIAGNOSIS — Z1231 Encounter for screening mammogram for malignant neoplasm of breast: Secondary | ICD-10-CM

## 2022-03-01 ENCOUNTER — Other Ambulatory Visit: Payer: Self-pay | Admitting: *Deleted

## 2022-03-01 ENCOUNTER — Inpatient Hospital Stay
Admission: RE | Admit: 2022-03-01 | Discharge: 2022-03-01 | Disposition: A | Discharge status: Home / Self Care | Payer: Self-pay | Source: Ambulatory Visit | Attending: *Deleted | Admitting: *Deleted

## 2022-03-01 DIAGNOSIS — Z1231 Encounter for screening mammogram for malignant neoplasm of breast: Secondary | ICD-10-CM

## 2022-05-27 ENCOUNTER — Encounter: Payer: Self-pay | Admitting: Emergency Medicine

## 2022-05-27 ENCOUNTER — Ambulatory Visit
Admission: EM | Admit: 2022-05-27 | Discharge: 2022-05-27 | Disposition: A | Discharge status: Home / Self Care | Payer: BC Managed Care – PPO | Attending: Emergency Medicine | Admitting: Emergency Medicine

## 2022-05-27 DIAGNOSIS — M25811 Other specified joint disorders, right shoulder: Secondary | ICD-10-CM

## 2022-05-27 DIAGNOSIS — M25511 Pain in right shoulder: Secondary | ICD-10-CM

## 2022-05-27 MED ORDER — IBUPROFEN 600 MG PO TABS: 600.0000 mg | ORAL_TABLET | Freq: Three times a day (TID) | ORAL | 0 refills | Status: AC | PRN

## 2022-05-27 MED ORDER — PREDNISONE 10 MG (21) PO TBPK: ORAL_TABLET | ORAL | 0 refills | Status: DC

## 2022-05-27 NOTE — ED Provider Notes (Signed)
HPI  SUBJECTIVE:  Diana Baldwin is a right-handed 61 y.o. female who presents with right trapezial pain that radiates down the humerus.  She is unable to sleep at night secondary to the pain.  She was lifting something light yesterday when the pain started.  She describes it as sharp, intermittent, "aggravated " lasting seconds to minutes.  No trauma, numbness or tingling, grip weakness, limitation of motion, erythema, swelling.  She reports difficulty performing her job secondary to the pain.  This is identical to previous impingement syndrome.  She has tried ibuprofen, Vytorin topical and rest.  The NSAIDs help.  Symptoms are worse with external rotation.  She has a past medical history of IBS and right shoulder impingement.  No history of chronic kidney disease, osteoporosis.  PCP: [facility]: Orthopedics: None.    Past Medical History:  Diagnosis Date   Depression     Past Surgical History:  Procedure Laterality Date   CESAREAN SECTION      Family History  Problem Relation Age of Onset   Cancer Mother    Alzheimer's disease Mother    Transient ischemic attack Mother    Cancer Father    Alzheimer's disease Father     Social History   Tobacco Use   Smoking status: Never   Smokeless tobacco: Never  Vaping Use   Vaping Use: Never used  Substance Use Topics   Alcohol use: Yes    Comment: social   Drug use: No    No current facility-administered medications for this encounter.  Current Outpatient Medications:    FLUoxetine (PROZAC) 10 MG capsule, Take 10 mg by mouth daily., Disp: , Rfl:    fluticasone (FLONASE) 50 MCG/ACT nasal spray, Place into both nostrils daily., Disp: , Rfl:    ibuprofen (ADVIL) 600 MG tablet, Take 1 tablet (600 mg total) by mouth every 8 (eight) hours as needed for up to 7 days., Disp: 21 tablet, Rfl: 0   predniSONE (STERAPRED UNI-PAK 21 TAB) 10 MG (21) TBPK tablet, Dispense one 6 day pack. Take as directed with food., Disp: 21 tablet,  Rfl: 0   rosuvastatin (CRESTOR) 5 MG tablet, TAKE (1) TABLET BY MOUTH EVERY DAY, Disp: , Rfl:    potassium chloride (K-DUR,KLOR-CON) 10 MEQ tablet, Take 10 mEq by mouth 2 (two) times daily., Disp: , Rfl:    Vitamin D, Ergocalciferol, (DRISDOL) 50000 UNITS CAPS capsule, Take 50,000 Units by mouth every 7 (seven) days., Disp: , Rfl:   No Known Allergies   ROS  As noted in HPI.   Physical Exam  BP 131/62 (BP Location: Left Arm)   Pulse 61   Temp 98.2 F (36.8 C) (Oral)   Ht 5\' 8"  (1.727 m)   Wt 74.8 kg   SpO2 100%   BMI 25.09 kg/m   Constitutional: Well developed, well nourished, no acute distress Eyes:  EOMI, conjunctiva normal bilaterally HENT: Normocephalic, atraumatic,mucus membranes moist Respiratory: Normal inspiratory effort Cardiovascular: Normal rate GI: nondistended skin: No rash, skin intact Musculoskeletal: R  shoulder with ROM normal, Drop test painful but negative, C-spine nontender, clavicle NT, A/C joint NT, scapula NT , tenderness mid humerus, mid bicep.  Proximal humerus NT, trapezius  tender, shoulder joint NT , Motor strength normal, Sensation intact LT over deltoid region, distal NVI with hand having intact sensation and strength in the median, radial, and ulnar nerve distribution.    no pain with internal rotation, no pain with external rotation,  negative tenderness in bicipital groove, negative  empty can test,  negative liftoff test,  no instability with abduction/external rotation. RP 2+  Neurologic: Alert & oriented x 3, no focal neuro deficits Psychiatric: Speech and behavior appropriate   ED Course   Medications - No data to display  Orders Placed This Encounter  Procedures   AMB referral to sports medicine    Referral Priority:   Routine    Referral Type:   Consultation    Referral Reason:   Specialty Services Required    Referred to Provider:   Montel Culver, MD    Number of Visits Requested:   1    No results found for this or any  previous visit (from the past 24 hour(s)). No results found.  ED Clinical Impression  1. Acute pain of right shoulder   2. 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  Deferring imaging in the absence of trauma.  Doubt fracture, dislocation.  Sending home with a prednisone 6-day taper, ibuprofen combined with Tylenol 3 times daily as needed, referring to Dr. Rosette Reveal, sports medicine.  She is to sleep with a pillow between her body and her arm.  Discussed MDM, treatment plan, and plan for follow-up with patient. patient agrees with plan.   Meds ordered this encounter  Medications   ibuprofen (ADVIL) 600 MG tablet    Sig: Take 1 tablet (600 mg total) by mouth every 8 (eight) hours as needed for up to 7 days.    Dispense:  21 tablet    Refill:  0   predniSONE (STERAPRED UNI-PAK 21 TAB) 10 MG (21) TBPK tablet    Sig: Dispense one 6 day pack. Take as directed with food.    Dispense:  21 tablet    Refill:  0      *This clinic note was created using L[employer]. Therefore, there may be occasional mistakes despite careful proofreading.  ?    Melynda Ripple, MD 05/28/22 [phone]

## 2022-05-27 NOTE — ED Triage Notes (Signed)
Pt states she was dx with impingement RT shoulder, pt states she was cleaning around the house this week picking stuff up, c/o RT shoulder pain into arm

## 2022-05-27 NOTE — Discharge Instructions (Signed)
Take the ibuprofen combined with 1000 mg of Tylenol 3 times a day as needed for pain, either ice, whichever feels better, sleep with a pillow between your body and your arm.  Finish the prednisone, unless a healthcare provider tells you to stop it.

## 2022-06-04 ENCOUNTER — Encounter: Payer: Self-pay | Admitting: Family Medicine

## 2022-08-29 ENCOUNTER — Other Ambulatory Visit: Payer: Self-pay

## 2022-08-29 DIAGNOSIS — M75121 Complete rotator cuff tear or rupture of right shoulder, not specified as traumatic: Secondary | ICD-10-CM

## 2022-08-29 DIAGNOSIS — G8929 Other chronic pain: Secondary | ICD-10-CM

## 2022-09-04 ENCOUNTER — Other Ambulatory Visit: Payer: Self-pay | Admitting: Orthopedic Surgery

## 2022-09-04 DIAGNOSIS — G8929 Other chronic pain: Secondary | ICD-10-CM

## 2022-09-04 DIAGNOSIS — M75121 Complete rotator cuff tear or rupture of right shoulder, not specified as traumatic: Secondary | ICD-10-CM

## 2022-09-18 ENCOUNTER — Ambulatory Visit
Admission: RE | Admit: 2022-09-18 | Discharge: 2022-09-18 | Disposition: A | Discharge status: Home / Self Care | Payer: BC Managed Care – PPO | Source: Ambulatory Visit | Attending: Orthopedic Surgery | Admitting: Orthopedic Surgery

## 2022-09-18 DIAGNOSIS — M75121 Complete rotator cuff tear or rupture of right shoulder, not specified as traumatic: Secondary | ICD-10-CM

## 2022-09-18 DIAGNOSIS — G8929 Other chronic pain: Secondary | ICD-10-CM

## 2022-11-12 ENCOUNTER — Other Ambulatory Visit: Payer: Self-pay | Admitting: Surgery

## 2022-11-14 ENCOUNTER — Encounter
Admission: RE | Admit: 2022-11-14 | Discharge: 2022-11-14 | Disposition: A | Discharge status: Home / Self Care | Payer: BC Managed Care – PPO | Source: Ambulatory Visit | Attending: Surgery | Admitting: Surgery

## 2022-11-14 HISTORY — DX: Unspecified rotator cuff tear or rupture of right shoulder, not specified as traumatic: M75.101

## 2022-11-14 HISTORY — DX: Other specified postprocedural states: Z98.890

## 2022-11-14 HISTORY — DX: Other specified postprocedural states: R11.2

## 2022-11-14 HISTORY — DX: Pure hypercholesterolemia, unspecified: E78.00

## 2022-11-14 HISTORY — DX: Anemia, unspecified: D64.9

## 2022-11-14 NOTE — Patient Instructions (Signed)
Your procedure is scheduled on:11-21-22 Thursday Report to the Registration Desk on the 1st floor of the [facility].Then proceed to the 2nd floor Surgery Desk To find out your arrival time, please call [phone] between 1PM - 3PM on:11-20-22 Wednesday If your arrival time is 6:00 am, do not arrive before that time as the [facility] entrance doors do not open until 6:00 am.  REMEMBER: Instructions that are not followed completely may result in serious medical risk, up to and including death; or upon the discretion of your surgeon and anesthesiologist your surgery may need to be rescheduled.  Do not eat food after midnight the night before surgery.  No gum chewing or hard candies.  You may however, drink CLEAR liquids up to 2 hours before you are scheduled to arrive for your surgery. Do not drink anything within 2 hours of your scheduled arrival time.  Clear liquids include: - water  - apple juice without pulp - gatorade (not RED colors) - black coffee or tea (Do NOT add milk or creamers to the coffee or tea) Do NOT drink anything that is not on this list  In addition, your doctor has ordered for you to drink the provided:  Ensure Pre-Surgery Clear Carbohydrate Drink  Drinking this carbohydrate drink up to two hours before surgery helps to reduce insulin resistance and improve patient outcomes. Please complete drinking 2 hours before scheduled arrival time.  One week prior to surgery:Last dose will be on 11-14-22 Stop Anti-inflammatories (NSAIDS) such as Advil, Aleve, Ibuprofen, Motrin, Naproxen, Naprosyn and Aspirin based products such as Excedrin, Goody's Powder, BC Powder.You may however, take Tylenol if needed for pain up until the day of surgery. Stop ANY OVER THE COUNTER supplements/vitamins NOW (11-14-22) until after surgery (Vitamin C, B12 and Potassium  TAKE ONLY THESE MEDICATIONS THE MORNING OF SURGERY WITH A SIP OF WATER: -.fexofenadine (ALLEGRA)  -FLUoxetine (PROZAC)   -rosuvastatin (CRESTOR)   No Alcohol for 24 hours before or after surgery.  No Smoking including e-cigarettes for 24 hours before surgery.  No chewable tobacco products for at least 6 hours before surgery.  No nicotine patches on the day of surgery.  Do not use any "recreational" drugs for at least a week (preferably 2 weeks) before your surgery.  Please be advised that the combination of cocaine and anesthesia may have negative outcomes, up to and including death. If you test positive for cocaine, your surgery will be cancelled.  On the morning of surgery brush your teeth with toothpaste and water, you may rinse your mouth with mouthwash if you wish. Do not swallow any toothpaste or mouthwash.  Use CHG Soap as directed on instruction sheet.  Do not wear jewelry, make-up, hairpins, clips or nail polish.  Do not wear lotions, powders, or perfumes.   Do not shave body hair from the neck down 48 hours before surgery.  Contact lenses, hearing aids and dentures may not be worn into surgery.  Do not bring valuables to the hospital. [facility] is not responsible for any missing/lost belongings or valuables.   Notify your doctor if there is any change in your medical condition (cold, fever, infection).  Wear comfortable clothing (specific to your surgery type) to the hospital.  After surgery, you can help prevent lung complications by doing breathing exercises.  Take deep breaths and cough every 1-2 hours. Your doctor may order a device called an Incentive Spirometer to help you take deep breaths. When coughing or sneezing, hold a pillow firmly  against your incision with both hands. This is called "splinting." Doing this helps protect your incision. It also decreases belly discomfort.  If you are being admitted to the hospital overnight, leave your suitcase in the car. After surgery it may be brought to your room.  In case of increased patient census, it may be necessary for you, the  patient, to continue your postoperative care in the Same Day Surgery department.  If you are being discharged the day of surgery, you will not be allowed to drive home. You will need a responsible individual to drive you home and stay with you for 24 hours after surgery.   If you are taking public transportation, you will need to have a responsible individual with you.  Please call the Pre-admissions Testing Dept. at [phone] if you have any questions about these instructions.  Surgery Visitation Policy:  Patients having surgery or a procedure may have two visitors.  Children under the age of 56 must have an adult with them who is not the patient.     Preparing for Surgery with CHLORHEXIDINE GLUCONATE (CHG) Soap  Chlorhexidine Gluconate (CHG) Soap  o An antiseptic cleaner that kills germs and bonds with the skin to continue killing germs even after washing  o Used for showering the night before surgery and morning of surgery  Before surgery, you can play an important role by reducing the number of germs on your skin.  CHG (Chlorhexidine gluconate) soap is an antiseptic cleanser which kills germs and bonds with the skin to continue killing germs even after washing.  Please do not use if you have an allergy to CHG or antibacterial soaps. If your skin becomes reddened/irritated stop using the CHG.  1. Shower the NIGHT BEFORE SURGERY and the MORNING OF SURGERY with CHG soap.  2. If you choose to wash your hair, wash your hair first as usual with your normal shampoo.  3. After shampooing, rinse your hair and body thoroughly to remove the shampoo.  4. Use CHG as you would any other liquid soap. You can apply CHG directly to the skin and wash gently with a scrungie or a clean washcloth.  5. Apply the CHG soap to your body only from the neck down. Do not use on open wounds or open sores. Avoid contact with your eyes, ears, mouth, and genitals (private parts). Wash face and  genitals (private parts) with your normal soap.  6. Wash thoroughly, paying special attention to the area where your surgery will be performed.  7. Thoroughly rinse your body with warm water.  8. Do not shower/wash with your normal soap after using and rinsing off the CHG soap.  9. Pat yourself dry with a clean towel.  10. Wear clean pajamas to bed the night before surgery.  12. Place clean sheets on your bed the night of your first shower and do not sleep with pets.  13. Shower again with the CHG soap on the day of surgery prior to arriving at the hospital.  14. Do not apply any deodorants/lotions/powders.    How to Use an Incentive Spirometer An incentive spirometer is a tool that measures how well you are filling your lungs with each breath. Learning to take long, deep breaths using this tool can help you keep your lungs clear and active. This may help to reverse or lessen your chance of developing breathing (pulmonary) problems, especially infection. You may be asked to use a spirometer: After a surgery. If you have  a lung problem or a history of smoking. After a long period of time when you have been unable to move or be active. If the spirometer includes an indicator to show the highest number that you have reached, your health care provider or respiratory therapist will help you set a goal. Keep a log of your progress as told by your health care provider. What are the risks? Breathing too quickly may cause dizziness or cause you to pass out. Take your time so you do not get dizzy or light-headed. If you are in pain, you may need to take pain medicine before doing incentive spirometry. It is harder to take a deep breath if you are having pain. How to use your incentive spirometer  Sit up on the edge of your bed or on a chair. Hold the incentive spirometer so that it is in an upright position. Before you use the spirometer, breathe out normally. Place the mouthpiece in your  mouth. Make sure your lips are closed tightly around it. Breathe in slowly and as deeply as you can through your mouth, causing the piston or the ball to rise toward the top of the chamber. Hold your breath for 3-5 seconds, or for as long as possible. If the spirometer includes a coach indicator, use this to guide you in breathing. Slow down your breathing if the indicator goes above the marked areas. Remove the mouthpiece from your mouth and breathe out normally. The piston or ball will return to the bottom of the chamber. Rest for a few seconds, then repeat the steps 10 or more times. Take your time and take a few normal breaths between deep breaths so that you do not get dizzy or light-headed. Do this every 1-2 hours when you are awake. If the spirometer includes a goal marker to show the highest number you have reached (best effort), use this as a goal to work toward during each repetition. After each set of 10 deep breaths, cough a few times. This will help to make sure that your lungs are clear. If you have an incision on your chest or abdomen from surgery, place a pillow or a rolled-up towel firmly against the incision when you cough. This can help to reduce pain while taking deep breaths and coughing. General tips When you are able to get out of bed: Walk around often. Continue to take deep breaths and cough in order to clear your lungs. Keep using the incentive spirometer until your health care provider says it is okay to stop using it. If you have been in the hospital, you may be told to keep using the spirometer at home. Contact a health care provider if: You are having difficulty using the spirometer. You have trouble using the spirometer as often as instructed. Your pain medicine is not giving enough relief for you to use the spirometer as told. You have a fever. Get help right away if: You develop shortness of breath. You develop a cough with bloody mucus from the lungs. You  have fluid or blood coming from an incision site after you cough. Summary An incentive spirometer is a tool that can help you learn to take long, deep breaths to keep your lungs clear and active. You may be asked to use a spirometer after a surgery, if you have a lung problem or a history of smoking, or if you have been inactive for a long period of time. Use your incentive spirometer as instructed every 1-2 hours  while you are awake. If you have an incision on your chest or abdomen, place a pillow or a rolled-up towel firmly against your incision when you cough. This will help to reduce pain. Get help right away if you have shortness of breath, you cough up bloody mucus, or blood comes from your incision when you cough. This information is not intended to replace advice given to you by your health care provider. Make sure you discuss any questions you have with your health care provider. Document Revised: 09/20/2019 Document Reviewed: 09/20/2019 Elsevier Patient Education  2023 [employer]  15. Please wear clean clothes to the hospital.

## 2022-11-21 ENCOUNTER — Ambulatory Visit
Admission: RE | Admit: 2022-11-21 | Discharge: 2022-11-21 | Disposition: A | Discharge status: Home / Self Care | Payer: BC Managed Care – PPO | Attending: Surgery | Admitting: Surgery

## 2022-11-21 ENCOUNTER — Ambulatory Visit: Payer: BC Managed Care – PPO

## 2022-11-21 ENCOUNTER — Ambulatory Visit: Payer: BC Managed Care – PPO | Admitting: Anesthesiology

## 2022-11-21 ENCOUNTER — Other Ambulatory Visit: Payer: Self-pay

## 2022-11-21 ENCOUNTER — Ambulatory Visit: Payer: BC Managed Care – PPO | Admitting: Urgent Care

## 2022-11-21 ENCOUNTER — Encounter: Payer: Self-pay | Admitting: Surgery

## 2022-11-21 ENCOUNTER — Encounter
Admission: RE | Disposition: A | Discharge status: Home / Self Care | Payer: Self-pay | Source: Home / Self Care | Attending: Surgery

## 2022-11-21 DIAGNOSIS — F32A Depression, unspecified: Secondary | ICD-10-CM | POA: Diagnosis not present

## 2022-11-21 DIAGNOSIS — M19011 Primary osteoarthritis, right shoulder: Secondary | ICD-10-CM | POA: Diagnosis not present

## 2022-11-21 DIAGNOSIS — X58XXXA Exposure to other specified factors, initial encounter: Secondary | ICD-10-CM | POA: Insufficient documentation

## 2022-11-21 DIAGNOSIS — S46011A Strain of muscle(s) and tendon(s) of the rotator cuff of right shoulder, initial encounter: Secondary | ICD-10-CM | POA: Insufficient documentation

## 2022-11-21 DIAGNOSIS — M25811 Other specified joint disorders, right shoulder: Secondary | ICD-10-CM | POA: Insufficient documentation

## 2022-11-21 HISTORY — PX: SHOULDER ARTHROSCOPY WITH SUBACROMIAL DECOMPRESSION, ROTATOR CUFF REPAIR AND BICEP TENDON REPAIR: SHX5687

## 2022-11-21 SURGERY — SHOULDER ARTHROSCOPY WITH SUBACROMIAL DECOMPRESSION, ROTATOR CUFF REPAIR AND BICEP TENDON REPAIR
Anesthesia: General | Site: Shoulder | Laterality: Right

## 2022-11-21 MED ORDER — BUPIVACAINE HCL (PF) 0.5 % IJ SOLN
INTRAMUSCULAR | Status: AC
  Filled 2022-11-21: qty 10

## 2022-11-21 MED ORDER — ROCURONIUM BROMIDE 10 MG/ML (PF) SYRINGE
PREFILLED_SYRINGE | INTRAVENOUS | Status: AC
  Filled 2022-11-21: qty 10

## 2022-11-21 MED ORDER — LIDOCAINE HCL (CARDIAC) PF 100 MG/5ML IV SOSY
PREFILLED_SYRINGE | INTRAVENOUS | Status: DC | PRN
  Administered 2022-11-21: 50 mg via INTRAVENOUS

## 2022-11-21 MED ORDER — MIDAZOLAM HCL 2 MG/2ML IJ SOLN
1.0000 mg | Freq: Once | INTRAMUSCULAR | Status: AC
  Administered 2022-11-21: 1 mg via INTRAVENOUS

## 2022-11-21 MED ORDER — PROPOFOL 10 MG/ML IV BOLUS
INTRAVENOUS | Status: AC
  Filled 2022-11-21: qty 20

## 2022-11-21 MED ORDER — LIDOCAINE HCL (PF) 1 % IJ SOLN
INTRAMUSCULAR | Status: AC
  Filled 2022-11-21: qty 5

## 2022-11-21 MED ORDER — FENTANYL CITRATE PF 50 MCG/ML IJ SOSY
PREFILLED_SYRINGE | INTRAMUSCULAR | Status: AC
  Filled 2022-11-21: qty 1

## 2022-11-21 MED ORDER — FAMOTIDINE 20 MG PO TABS
20.0000 mg | ORAL_TABLET | Freq: Once | ORAL | Status: AC
  Administered 2022-11-21: 20 mg via ORAL

## 2022-11-21 MED ORDER — CHLORHEXIDINE GLUCONATE 0.12 % MT SOLN
OROMUCOSAL | Status: AC
  Filled 2022-11-21: qty 15

## 2022-11-21 MED ORDER — FENTANYL CITRATE (PF) 100 MCG/2ML IJ SOLN: 25.0000 ug | INTRAMUSCULAR | Status: DC | PRN

## 2022-11-21 MED ORDER — EPHEDRINE SULFATE (PRESSORS) 50 MG/ML IJ SOLN
INTRAMUSCULAR | Status: DC | PRN
  Administered 2022-11-21 (×2): 10 mg via INTRAVENOUS

## 2022-11-21 MED ORDER — DEXAMETHASONE SODIUM PHOSPHATE 10 MG/ML IJ SOLN
INTRAMUSCULAR | Status: AC
  Filled 2022-11-21: qty 1

## 2022-11-21 MED ORDER — ACETAMINOPHEN 10 MG/ML IV SOLN
INTRAVENOUS | Status: DC | PRN
  Administered 2022-11-21: 1000 mg via INTRAVENOUS

## 2022-11-21 MED ORDER — CHLORHEXIDINE GLUCONATE 0.12 % MT SOLN
15.0000 mL | Freq: Once | OROMUCOSAL | Status: AC
  Administered 2022-11-21: 15 mL via OROMUCOSAL

## 2022-11-21 MED ORDER — BUPIVACAINE HCL (PF) 0.5 % IJ SOLN
INTRAMUSCULAR | Status: DC | PRN
  Administered 2022-11-21: 10 mL via PERINEURAL

## 2022-11-21 MED ORDER — RINGERS IRRIGATION IR SOLN
Status: DC | PRN
  Administered 2022-11-21: 1500 mL
  Administered 2022-11-21: 3000 mL

## 2022-11-21 MED ORDER — FENTANYL CITRATE PF 50 MCG/ML IJ SOSY
50.0000 ug | PREFILLED_SYRINGE | Freq: Once | INTRAMUSCULAR | Status: AC
  Administered 2022-11-21: 50 ug via INTRAVENOUS

## 2022-11-21 MED ORDER — ONDANSETRON HCL 4 MG/2ML IJ SOLN
INTRAMUSCULAR | Status: AC
  Filled 2022-11-21: qty 2

## 2022-11-21 MED ORDER — LIDOCAINE HCL (PF) 1 % IJ SOLN
INTRAMUSCULAR | Status: DC | PRN
  Administered 2022-11-21: 4 mL via SUBCUTANEOUS

## 2022-11-21 MED ORDER — PHENYLEPHRINE HCL-NACL 20-0.9 MG/250ML-% IV SOLN
INTRAVENOUS | Status: AC
  Filled 2022-11-21: qty 250

## 2022-11-21 MED ORDER — LACTATED RINGERS IR SOLN
Status: DC | PRN
  Administered 2022-11-21: 3001 mL

## 2022-11-21 MED ORDER — SUGAMMADEX SODIUM 200 MG/2ML IV SOLN
INTRAVENOUS | Status: DC | PRN
  Administered 2022-11-21: 200 mg via INTRAVENOUS

## 2022-11-21 MED ORDER — DROPERIDOL 2.5 MG/ML IJ SOLN: 0.6250 mg | Freq: Once | INTRAMUSCULAR | Status: DC | PRN

## 2022-11-21 MED ORDER — CEFAZOLIN SODIUM-DEXTROSE 2-4 GM/100ML-% IV SOLN
2.0000 g | INTRAVENOUS | Status: AC
  Administered 2022-11-21: 2 g via INTRAVENOUS

## 2022-11-21 MED ORDER — FENTANYL CITRATE (PF) 100 MCG/2ML IJ SOLN
INTRAMUSCULAR | Status: AC
  Filled 2022-11-21: qty 2

## 2022-11-21 MED ORDER — PROPOFOL 10 MG/ML IV BOLUS
INTRAVENOUS | Status: DC | PRN
  Administered 2022-11-21: 50 mg via INTRAVENOUS
  Administered 2022-11-21: 150 mg via INTRAVENOUS

## 2022-11-21 MED ORDER — ACETAMINOPHEN 10 MG/ML IV SOLN
INTRAVENOUS | Status: AC
  Filled 2022-11-21: qty 100

## 2022-11-21 MED ORDER — MIDAZOLAM HCL 2 MG/2ML IJ SOLN
INTRAMUSCULAR | Status: AC
  Filled 2022-11-21: qty 2

## 2022-11-21 MED ORDER — BUPIVACAINE HCL (PF) 0.5 % IJ SOLN
INTRAMUSCULAR | Status: AC
  Filled 2022-11-21: qty 30

## 2022-11-21 MED ORDER — BUPIVACAINE LIPOSOME 1.3 % IJ SUSP
INTRAMUSCULAR | Status: AC
  Filled 2022-11-21: qty 20

## 2022-11-21 MED ORDER — PHENYLEPHRINE 80 MCG/ML (10ML) SYRINGE FOR IV PUSH (FOR BLOOD PRESSURE SUPPORT)
PREFILLED_SYRINGE | INTRAVENOUS | Status: DC | PRN
  Administered 2022-11-21: 80 ug via INTRAVENOUS
  Administered 2022-11-21: 160 ug via INTRAVENOUS

## 2022-11-21 MED ORDER — EPHEDRINE 5 MG/ML INJ
INTRAVENOUS | Status: AC
  Filled 2022-11-21: qty 5

## 2022-11-21 MED ORDER — EPINEPHRINE 1 MG/10ML IJ SOSY
PREFILLED_SYRINGE | INTRAMUSCULAR | Status: DC | PRN
  Administered 2022-11-21: 30.15 mL

## 2022-11-21 MED ORDER — DEXMEDETOMIDINE HCL IN NACL 80 MCG/20ML IV SOLN
INTRAVENOUS | Status: DC | PRN
  Administered 2022-11-21: 8 ug via INTRAVENOUS

## 2022-11-21 MED ORDER — EPINEPHRINE PF 1 MG/ML IJ SOLN
INTRAMUSCULAR | Status: AC
  Filled 2022-11-21: qty 2

## 2022-11-21 MED ORDER — ROCURONIUM BROMIDE 100 MG/10ML IV SOLN
INTRAVENOUS | Status: DC | PRN
  Administered 2022-11-21: 50 mg via INTRAVENOUS
  Administered 2022-11-21: 20 mg via INTRAVENOUS

## 2022-11-21 MED ORDER — PHENYLEPHRINE HCL-NACL 20-0.9 MG/250ML-% IV SOLN
INTRAVENOUS | Status: DC | PRN
  Administered 2022-11-21: 15 ug/min via INTRAVENOUS

## 2022-11-21 MED ORDER — PHENYLEPHRINE 80 MCG/ML (10ML) SYRINGE FOR IV PUSH (FOR BLOOD PRESSURE SUPPORT)
PREFILLED_SYRINGE | INTRAVENOUS | Status: AC
  Filled 2022-11-21: qty 10

## 2022-11-21 MED ORDER — LACTATED RINGERS IV SOLN: INTRAVENOUS | Status: DC

## 2022-11-21 MED ORDER — FAMOTIDINE 20 MG PO TABS
ORAL_TABLET | ORAL | Status: AC
  Filled 2022-11-21: qty 1

## 2022-11-21 MED ORDER — ORAL CARE MOUTH RINSE: 15.0000 mL | Freq: Once | OROMUCOSAL | Status: AC

## 2022-11-21 MED ORDER — FENTANYL CITRATE (PF) 100 MCG/2ML IJ SOLN
INTRAMUSCULAR | Status: DC | PRN
  Administered 2022-11-21: 50 ug via INTRAVENOUS

## 2022-11-21 MED ORDER — DEXAMETHASONE SODIUM PHOSPHATE 10 MG/ML IJ SOLN
INTRAMUSCULAR | Status: DC | PRN
  Administered 2022-11-21: 10 mg via INTRAVENOUS

## 2022-11-21 MED ORDER — LIDOCAINE HCL (PF) 2 % IJ SOLN
INTRAMUSCULAR | Status: AC
  Filled 2022-11-21: qty 5

## 2022-11-21 MED ORDER — ONDANSETRON HCL 4 MG/2ML IJ SOLN
INTRAMUSCULAR | Status: DC | PRN
  Administered 2022-11-21: 4 mg via INTRAVENOUS

## 2022-11-21 MED ORDER — BUPIVACAINE LIPOSOME 1.3 % IJ SUSP
INTRAMUSCULAR | Status: DC | PRN
  Administered 2022-11-21: 20 mL via PERINEURAL

## 2022-11-21 MED ORDER — OXYCODONE HCL 5 MG PO TABS: 5.0000 mg | ORAL_TABLET | ORAL | 0 refills | Status: AC | PRN

## 2022-11-21 SURGICAL SUPPLY — 65 items
ANCH SUT 2 2/0 ABS BRD STRL (Anchor) ×1 IMPLANT
ANCH SUT 5.5 KNTLS (Anchor) ×2 IMPLANT
ANCH SUT BN ASCP DLV (Anchor) ×1 IMPLANT
ANCH SUT Q-FX 2.8 (Anchor) ×2 IMPLANT
ANCH SUT RGNRT REGENETEN (Staple) ×1 IMPLANT
ANCHOR ALL-SUT Q-FIX 2.8 (Anchor) IMPLANT
ANCHOR BONE REGENETEN (Anchor) IMPLANT
ANCHOR HEALICOIL REGEN 5.5 (Anchor) IMPLANT
ANCHOR SUT W/ ORTHOCORD (Anchor) IMPLANT
ANCHOR TENDON REGENETEN (Staple) IMPLANT
APL PRP STRL LF DISP 70% ISPRP (MISCELLANEOUS) ×1
BIT DRILL JUGRKNT W/NDL BIT2.9 (DRILL) IMPLANT
BLADE FULL RADIUS 3.5 (BLADE) ×1 IMPLANT
BUR ACROMIONIZER 4.0 (BURR) ×1 IMPLANT
CANNULA SHAVER 8MMX76MM (CANNULA) ×1 IMPLANT
CHLORAPREP W/TINT 26 (MISCELLANEOUS) ×1 IMPLANT
COOLER POLAR GLACIER W/PUMP (MISCELLANEOUS) IMPLANT
COVER MAYO STAND REUSABLE (DRAPES) ×1 IMPLANT
DILATOR 5.5 THREADED HEALICOIL (MISCELLANEOUS) IMPLANT
DRILL JUGGERKNOT W/NDL BIT 2.9 (DRILL)
ELECT CAUTERY BLADE 6.4 (BLADE) ×1 IMPLANT
ELECT REM PT RETURN 9FT ADLT (ELECTROSURGICAL) ×1
ELECTRODE REM PT RTRN 9FT ADLT (ELECTROSURGICAL) ×1 IMPLANT
GAUZE SPONGE 4X4 12PLY STRL (GAUZE/BANDAGES/DRESSINGS) ×1 IMPLANT
GAUZE XEROFORM 1X8 LF (GAUZE/BANDAGES/DRESSINGS) ×1 IMPLANT
GLOVE BIO SURGEON STRL SZ7.5 (GLOVE) ×2 IMPLANT
GLOVE BIO SURGEON STRL SZ8 (GLOVE) ×2 IMPLANT
GLOVE BIOGEL PI IND STRL 8 (GLOVE) ×1 IMPLANT
GLOVE INDICATOR 8.0 STRL GRN (GLOVE) ×1 IMPLANT
GOWN STRL REUS W/ TWL LRG LVL3 (GOWN DISPOSABLE) ×1 IMPLANT
GOWN STRL REUS W/ TWL XL LVL3 (GOWN DISPOSABLE) ×1 IMPLANT
GOWN STRL REUS W/TWL LRG LVL3 (GOWN DISPOSABLE) ×1
GOWN STRL REUS W/TWL XL LVL3 (GOWN DISPOSABLE) ×1
GRASPER SUT 15 45D LOW PRO (SUTURE) IMPLANT
IMPL REGENETEN MEDIUM (Shoulder) IMPLANT
IMPLANT REGENETEN MEDIUM (Shoulder) ×1 IMPLANT
IV LACTATED RINGER IRRG 3000ML (IV SOLUTION) ×2
IV LR IRRIG 3000ML ARTHROMATIC (IV SOLUTION) ×2 IMPLANT
KIT CANNULA 8X76-LX IN CANNULA (CANNULA) ×1 IMPLANT
KIT SUTURE 2.8 Q-FIX DISP (MISCELLANEOUS) IMPLANT
MANIFOLD NEPTUNE II (INSTRUMENTS) ×2 IMPLANT
MASK FACE SPIDER DISP (MASK) ×1 IMPLANT
MAT ABSORB  FLUID 56X50 GRAY (MISCELLANEOUS) ×1
MAT ABSORB FLUID 56X50 GRAY (MISCELLANEOUS) ×1 IMPLANT
PACK ARTHROSCOPY SHOULDER (MISCELLANEOUS) ×1 IMPLANT
PAD ABD DERMACEA PRESS 5X9 (GAUZE/BANDAGES/DRESSINGS) ×2 IMPLANT
PAD WRAPON POLAR SHDR UNIV (MISCELLANEOUS) IMPLANT
PASSER SUT FIRSTPASS SELF (INSTRUMENTS) IMPLANT
SLEEVE REMOTE CONTROL 5X12 (DRAPES) IMPLANT
SLING ARM LRG DEEP (SOFTGOODS) ×1 IMPLANT
SLING ULTRA II LG (MISCELLANEOUS) ×1 IMPLANT
SPONGE T-LAP 18X18 ~~LOC~~+RFID (SPONGE) ×1 IMPLANT
STAPLER SKIN PROX 35W (STAPLE) ×1 IMPLANT
STRAP SAFETY 5IN WIDE (MISCELLANEOUS) ×1 IMPLANT
SUT ETHIBOND 0 MO6 C/R (SUTURE) ×1 IMPLANT
SUT ULTRABRAID 2 COBRAID 38 (SUTURE) IMPLANT
SUT VIC AB 2-0 CT1 27 (SUTURE) ×2
SUT VIC AB 2-0 CT1 TAPERPNT 27 (SUTURE) ×2 IMPLANT
TAPE MICROFOAM 4IN (TAPE) ×1 IMPLANT
TRAP FLUID SMOKE EVACUATOR (MISCELLANEOUS) ×1 IMPLANT
TUBE SET DOUBLEFLO INFLOW (TUBING) ×1 IMPLANT
TUBING CONNECTING 10 (TUBING) ×1 IMPLANT
WAND WEREWOLF FLOW 90D (MISCELLANEOUS) ×1 IMPLANT
WATER STERILE IRR 500ML POUR (IV SOLUTION) ×1 IMPLANT
WRAPON POLAR PAD SHDR UNIV (MISCELLANEOUS) ×1

## 2022-11-21 NOTE — Transfer of Care (Signed)
Immediate Anesthesia Transfer of Care Note  Patient: Diana Baldwin  Procedure(s) Performed: RIGHT SHOULDER ARTHROSCOPY WITH DEBRIDEMENT, DECOMPRESSION, ROTATOR CUFF REPAIR, (Right: Shoulder)  Patient Location: PACU  Anesthesia Type:General  Level of Consciousness: awake, alert , oriented, and patient cooperative  Airway & Oxygen Therapy: Patient Spontanous Breathing and Patient connected to face mask oxygen  Post-op Assessment: Report given to RN and Post -op Vital signs reviewed and stable  Post vital signs: Reviewed and stable  Last Vitals:  Vitals Value Taken Time  BP 112/47 11/21/22 1250  Temp 36 C 11/21/22 1250  Pulse 61 11/21/22 1254  Resp 20 11/21/22 1254  SpO2 100 % 11/21/22 1254  Vitals shown include unvalidated device data.  Last Pain:  Vitals:   11/21/22 0840  TempSrc: Temporal  PainSc: 3          Complications: No notable events documented.

## 2022-11-21 NOTE — Anesthesia Procedure Notes (Signed)
Anesthesia Regional Block: Interscalene brachial plexus block   Pre-Anesthetic Checklist: , timeout performed,  Correct Patient, Correct Site, Correct Laterality,  Correct Procedure, Correct Position, site marked,  Risks and benefits discussed,  Surgical consent,  Pre-op evaluation,  At surgeon's request and post-op pain management  Laterality: Right and Upper  Prep: chloraprep       Needles:  Injection technique: Single-shot  Needle Type: Stimiplex     Needle Length: 5cm  Needle Gauge: 22     Additional Needles:   Procedures:,,,, ultrasound used (permanent image in chart),,    Narrative:  Start time: 11/21/2022 9:44 AM End time: 11/21/2022 9:47 AM Injection made incrementally with aspirations every 5 mL.  Performed by: Personally  Anesthesiologist: Lenard Simmer, MD  Additional Notes: Functioning IV was confirmed and monitors were applied.  A 50mm 22ga Stimuplex needle was used. Sterile prep and drape,hand hygiene and sterile gloves were used.  Negative aspiration and negative test dose prior to incremental administration of local anesthetic. The patient tolerated the procedure well.

## 2022-11-21 NOTE — Op Note (Signed)
11/21/2022  12:34 PM  Patient:   Diana Baldwin  Pre-Op Diagnosis:   Impingement/tendinopathy with full-thickness rotator cuff tear, right shoulder.  Post-Op Diagnosis:   Impingement/tendinopathy with full-thickness rotator cuff tear, labral fraying, synovitis, and early degenerative joint disease, right shoulder.  Procedure:   Extensive arthroscopic debridement, arthroscopic repair of partial-thickness subscapularis tendon tear, arthroscopic subacromial decompression, and mini-open repair of supraspinatus tendon tear supplemented with a Smith & Nephew Regeneten patch, right shoulder.  Anesthesia:   General endotracheal with interscalene block using Exparel placed preoperatively by the anesthesiologist.  Surgeon:   Maryagnes Amos, MD  Assistant:   Horris Latino, PA-C  Findings:   As above. There was moderate labral fraying involving the anterior, superior, and posterior superior portions of the labrum without frank detachment from the glenoid rim. The biceps tendon had chronically torn and retracted distally. There was a full-thickness tear involving the mid and posterior portions of the supraspinatus tendon insertion. There also was a partial-thickness tear involving the superior 25% of the subscapularis tendon insertional fibers with approximately 50% of the footprint exposed.  The remainder of the rotator cuff was in satisfactory condition. There were grade II-III chondromalacia changes involving the central portion of the humeral head. The glenoid articular surface was in satisfactory condition.  Complications:   None  Fluids:   600 cc  Estimated blood loss:   5 cc  Tourniquet time:   None  Drains:   None  Closure:   Staples      Brief clinical note:   The patient is a 62 year old female with a history of progressively worsening right shoulder pain. The patient's symptoms have progressed despite medications, activity modification, etc. The patient's history and examination  are consistent with impingement/tendinopathy with a rotator cuff tear. These findings were confirmed by MRI scan. The patient presents at this time for definitive management of these shoulder symptoms.  Procedure:   The patient underwent placement of an interscalene block by the anesthesiologist in the preoperative holding area before being brought into the operating room and lain in the supine position. The patient then underwent general endotracheal intubation and anesthesia before being repositioned in the beach chair position using the beach chair positioner. The right shoulder and upper extremity were prepped with ChloraPrep solution before being draped sterilely. Preoperative antibiotics were administered. A timeout was performed to confirm the proper surgical site before the expected portal sites and incision site were injected with 0.5% Sensorcaine with epinephrine.   A posterior portal was created and the glenohumeral joint thoroughly inspected with the findings as described above. An anterior portal was created using an outside-in technique. The labrum and rotator cuff were further probed, again confirming the above-noted findings. The areas of labral fraying were debrided back to stable margins using the full-radius resector. In addition, the full-radius resector was used to debride the areas of loose articular cartilage fragments involving the central portion of the humeral head, as well as areas of significant synovitis anteriorly and superiorly. Finally, the full-radius resector was used to debride the torn margins of the supraspinatus and subscapularis tendons. The ArthroCare wand was inserted and used to obtain hemostasis as well as to "anneal" the labrum superiorly and anteriorly.   The partial-thickness tear involving the superior insertional fibers of the subscapularis tendon was repaired using a Mitek BioKnotless anchor placed through the anterior portal. A separate superolateral portal  site was created using an outside-in technique to serve as a working portal. In addition, the exposed portion  of the lesser tuberosity was roughened with the full-radius resector to form a bed of good bleeding bone to optimize healing. The adequacy of repair was verified both by probing as well as with passive external rotation of the arm and found to be excellent. The instruments were removed from the joint after suctioning the excess fluid.  The camera was repositioned through the posterior portal into the subacromial space. A separate lateral portal was created using an outside-in technique. The 3.5 mm full-radius resector was introduced and used to perform a subtotal bursectomy. The ArthroCare wand was then inserted and used to remove the periosteal tissue off the undersurface of the anterior third of the acromion as well as to recess the coracoacromial ligament from its attachment along the anterior and lateral margins of the acromion. The 4.0 mm acromionizing bur was introduced and used to complete the decompression by removing the undersurface of the anterior third of the acromion. The full radius resector was reintroduced to remove any residual bony debris before the ArthroCare wand was reintroduced to obtain hemostasis. The instruments were then removed from the subacromial space after suctioning the excess fluid.  An approximately 4-5 cm incision was made over the anterolateral aspect of the shoulder beginning at the anterolateral corner of the acromion and extending distally in line with the bicipital groove. This incision was carried down through the subcutaneous tissues to expose the deltoid fascia. The raphae between the anterior and middle thirds was identified and this plane developed to provide access into the subacromial space. Additional bursal tissues were debrided sharply using Metzenbaum scissors. The rotator cuff tear was readily identified. The margins were debrided sharply with a #15  blade and the exposed greater tuberosity roughened with a rongeur. The tear was repaired using two Smith & Nephew 2.9 mm Q-Fix anchors. These sutures were then brought back laterally and secured using two Smith & Nephew Healicoil knotless RegeneSorb anchors to create a two-layer closure. An apparent watertight closure was obtained.  The rotator cuff tissue was noted to be of relatively poor quality so it was elected to reinforce this repair using a Smith & Nephew Regeneten patch. The medium patch was selected and applied over the repair site before being secured using the appropriate bone and soft tissue staples. The addition of this portion of the procedure added additional complexity to the procedure. In addition, it added an extra 20 to 30 minutes of surgical time to the procedure.  The wound was copiously irrigated with sterile saline solution before the deltoid raphae was reapproximated using 2-0 Vicryl interrupted sutures. The subcutaneous tissues were closed in two layers using 2-0 Vicryl interrupted sutures before the skin was closed using staples. The portal sites also were closed using staples. A sterile bulky dressing was applied to the shoulder before the arm was placed into a shoulder immobilizer. In addition, a Polar Care device was applied to the shoulder. The patient was then awakened, extubated, and returned to the recovery room in satisfactory condition after tolerating the procedure well.

## 2022-11-21 NOTE — Anesthesia Procedure Notes (Signed)
Procedure Name: Intubation Date/Time: 11/21/2022 10:53 AM  Performed by: Jeannene Patella, CRNAPre-anesthesia Checklist: Patient identified, Emergency Drugs available, Suction available, Patient being monitored and Timeout performed Patient Re-evaluated:Patient Re-evaluated prior to induction Oxygen Delivery Method: Circle system utilized Preoxygenation: Pre-oxygenation with 100% oxygen Induction Type: IV induction Ventilation: Mask ventilation without difficulty Laryngoscope Size: McGraph and 3 Grade View: Grade I Tube type: Oral Tube size: 7.0 mm Number of attempts: 1 Airway Equipment and Method: Stylet and Video-laryngoscopy Placement Confirmation: ETT inserted through vocal cords under direct vision, positive ETCO2 and breath sounds checked- equal and bilateral Secured at: 20 cm Tube secured with: Tape Dental Injury: Teeth and Oropharynx as per pre-operative assessment

## 2022-11-21 NOTE — H&P (Signed)
Histroy of Present Illness:  Diana Baldwin is a 62 y.o. female who presents today as a result of a referral by Dr. Kennedy Bucker for right shoulder pain and weakness.   The patient's symptoms began over a year ago and developed without any specific cause or injury . Her symptoms worsened about 5 months ago so she presented to urgent care where she was diagnosed with impingement/tendinopathy. Because of continued symptoms, she was referred to orthopedics. She saw Dr. Rosita Kea 2 months ago and was sent for an MRI scan, and then referred to me for further evaluation and treatment. The patient describes the symptoms as marked (major pain with significant limitations) and have the quality of being aching, miserable, nagging, stabbing, tender, and throbbing. The pain is localized to the lateral arm/shoulder. These symptoms are aggravated with normal daily activities, with sleeping, carrying heavy objects, at higher levels of activity, with overhead activity, and reaching behind the back. She has tried non-steroidal anti-inflammatories (Naproxen ) with limited benefit. She has tried rest and heat with no significant benefit. She has not tried any formal physical therapy or steroid injections for the symptoms. She denies any neck pain, nor does she note any numbness or paresthesias down her arm to her hand. She is right-hand dominant. 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 This complaint is not work related. She is a sports non-participant.  Shoulder Surgical History:  The patient has had no shoulder surgery in the past.  PMH/PSH/Family History/Social History/Meds/Allergies:  I have reviewed past medical, surgical, social and family history, medications and allergies as documented in the EMR.  Current Outpatient Medications: cholecalciferol (VITAMIN D3) 2,000 unit capsule Take 2,000 Units by mouth once daily.  cyclobenzaprine (FLEXERIL) 5 MG tablet Take 1-2 tablets (5-10 mg  total) by mouth 3 (three) times daily as needed for Muscle spasms 30 tablet 0  fexofenadine (ALLEGRA) 180 MG tablet Take 180 mg by mouth once daily.  FLUoxetine (PROZAC) 40 MG capsule TAKE (1) CAPSULE BY MOUTH EVERY DAY 90 capsule 3  fluticasone propionate (FLONASE) 50 mcg/actuation nasal spray 2 SPRAYS INTO EACH NOSTRIL ONCE DAILY 48 g 3  ibuprofen (MOTRIN) 600 MG tablet  MULTIVIT WITH IRON,HEMATINIC (SUPER B-COMPLEX ORAL) Take by mouth.  potassium 99 mg Tab Take by mouth.  rosuvastatin (CRESTOR) 5 MG tablet TAKE (1) TABLET BY MOUTH EVERY DAY 90 tablet 3   Allergies: No Known Allergies  Past Medical History:  Depression  History of bone density study 07/17/2009  Hyperlipidemia  Impaired glucose tolerance  Personal history of colonic polyps  Seasonal allergic rhinitis  Vitamin D deficiency   Past Surgical History:  COLONOSCOPY 08/24/2014 (Adenomatous Polyp, [facility] (Mother): CBF 08/2017)  COLONOSCOPY N/A 11/05/2021 (Procedure: COLONOSCOPY, FLEXIBLE; DIAGNOSTIC, INCLUDING COLLECTION OF SPECIMEN(S) BY BRUSHING OR WASHING, WHEN PERFORMED (SEPARATE PROCEDURE); Surgeon: Elnita Maxwell, MD; Location: [facility]; Service: Gastroenterology) 10/24/2021 Reviewed / Eather Colas [id number]  CESAREAN SECTION  COLONOSCOPY 07/02/2012, 10/17/2008 (Adenomatous Polyps, [facility] (Mother): CBF 06/2014)  LAPAROSCOPIC CHOLECYSTECTOMY   Family History:  Alzheimer's disease Mother  Colon polyps Mother  Colon cancer Mother  Alzheimer's disease Father  Coronary Artery Disease (Blocked arteries around heart) Father  Alzheimer's disease Sister  Colon cancer Maternal Grandfather  Alzheimer's disease Brother  Coronary Artery Disease (Blocked arteries around heart) Brother  Breast cancer Neg Hx   Social History:   Socioeconomic History:  Marital status: Divorced  Tobacco Use  Smoking status: Never  Smokeless tobacco: Never  Vaping Use  Vaping Use: Never used  Substance and Sexual  Activity  Alcohol use: Yes  Comment: occasional  Drug use: No   Review of Systems:  A comprehensive 14 point ROS was performed, reviewed, and the pertinent orthopaedic findings are documented in the HPI.  Physical Exam:  Vitals:  10/23/22 1041  BP: 122/84  Weight: 72.7 kg (160 lb 3.2 oz)  Height: 172.7 cm (5\' 8" )  PainSc: 3  PainLoc: Shoulder   General/Constitutional: The patient appears to be well-nourished, well-developed, and in no acute distress. Neuro/Psych: Normal mood and affect, oriented to person, place and time. Eyes: Non-icteric. Pupils are equal, round, and reactive to light, and exhibit synchronous movement. ENT: Unremarkable. Lymphatic: No palpable adenopathy. Respiratory: Lungs clear to auscultation, Normal chest excursion, No wheezes, and Non-labored breathing Cardiovascular: Regular rate and rhythm. No murmurs. and No edema, swelling or tenderness, except as noted in detailed exam. Integumentary: No impressive skin lesions present, except as noted in detailed exam. Musculoskeletal: Unremarkable, except as noted in detailed exam.  Right shoulder exam: SKIN: normal SWELLING: none WARMTH: none LYMPH NODES: no adenopathy palpable CREPITUS: none TENDERNESS: Mildly tender over anterolateral shoulder ROM (active):  Forward flexion: 135 degrees Abduction: 130 degrees Internal rotation: L3 ROM (passive):  Forward flexion: 155 degrees Abduction: 150 degrees ER/IR at 90 abd: 90 degrees / 30 degrees  She experiences moderate pain with internal rotation at 90 degrees of abduction, and mild pain with forward flexion, abduction, and internal rotation.  STRENGTH: Forward flexion: 4/5 Abduction: 4/5 External rotation: 4-4+/5 Internal rotation: 4+/5 Pain with RC testing: Mild pain with resisted forward flexion and abduction  STABILITY: Normal  SPECIAL TESTS: Juanetta Gosling' test: positive, moderate Speed's test: positive Capsulitis - pain w/ passive ER: no Crossed arm  test: Mildly positive Crank: Not evaluated Anterior apprehension: Negative Posterior apprehension: Not evaluated  She is neurovascularly intact to the right upper extremity.  Imaging:  Shoulder X-Ray Imaging: Recent true AP, Y-scapular, and axillary views of the right shoulder are available for review and have been reviewed by myself. These films demonstrate mild degenerative changes as manifest by minimal inferior humeral osteophyte and mild "beaking" of the inferior glenoid. The subacromial space is moderately decreased . There is a large subacromial spur. She demonstrates a Type III acromion.  Right Shoulder Imaging, MRI: MRI Shoulder Cartilage: Mild degenerative changes of the glenohumeral joint MRI Shoulder Rotator Cuff: Full thickness tear of the supraspinatus. Retracted to the humeral head. MRI Shoulder Labrum / Biceps: Biceps tendinopathy. MRI Shoulder Bone: Normal bone.  Both the films and report reviewed by myself and discussed with the patient.  Assessment:  1. Nontraumatic complete tear of right rotator cuff.  2. Injury of long head of biceps.  3. Rotator cuff tendinitis, right.   Plan:  The treatment options were discussed with the patient. In addition, patient educational materials were provided regarding the diagnosis and treatment options. The patient is quite frustrated by her symptoms and functional limitations, and is ready to consider more aggressive treatment options. Therefore, I have recommended a surgical procedure, specifically a right shoulder arthroscopy with debridement, decompression, rotator cuff repair, and biceps tenodesis. The procedure was discussed with the patient, as were the potential risks (including bleeding, infection, nerve and/or blood vessel injury, persistent or recurrent pain, failure of the repair, progression of arthritis, need for further surgery, blood clots, strokes, heart attacks and/or arhythmias, pneumonia, etc.) and benefits. The patient  states her understanding and wishes to proceed. All of the patient's questions and concerns were  answered. She can call any time with further concerns. She will follow up post-surgery, routine.    H&P reviewed and patient re-examined. No changes.

## 2022-11-21 NOTE — Discharge Instructions (Addendum)
Orthopedic discharge instructions: Keep dressing dry and intact.  May shower after dressing changed on post-op day #4 (Monday).  Cover staples with Band-Aids after drying off. Apply ice frequently to shoulder. Take Aleve 2 tablets BID with meals for 5-7 days, then as necessary. Take oxycodone as prescribed when needed.  May supplement with ES Tylenol if necessary. Keep shoulder immobilizer on at all times except may remove for bathing purposes. Follow-up in 10-14 days or as scheduled.  AMBULATORY SURGERY  DISCHARGE INSTRUCTIONS   The drugs that you were given will stay in your system until tomorrow so for the next 24 hours you should not:  Drive an automobile Make any legal decisions Drink any alcoholic beverage   You may resume regular meals tomorrow.  Today it is better to start with liquids and gradually work up to solid foods.  You may eat anything you prefer, but it is better to start with liquids, then soup and crackers, and gradually work up to solid foods.   Please notify your doctor immediately if you have any unusual bleeding, trouble breathing, redness and pain at the surgery site, drainage, fever, or pain not relieved by medication.    Additional Instructions:     Information for Discharge Teaching: EXPAREL (bupivacaine liposome injectable suspension)   Your surgeon or anesthesiologist gave you EXPAREL(bupivacaine) to help control your pain after surgery.  EXPAREL is a local anesthetic that provides pain relief by numbing the tissue around the surgical site. EXPAREL is designed to release pain medication over time and can control pain for up to 72 hours. Depending on how you respond to EXPAREL, you may require less pain medication during your recovery.  Possible side effects: Temporary loss of sensation or ability to move in the area where bupivacaine was injected. Nausea, vomiting, constipation Rarely, numbness and tingling in your mouth or lips,  lightheadedness, or anxiety may occur. Call your doctor right away if you think you may be experiencing any of these sensations, or if you have other questions regarding possible side effects.  Follow all other discharge instructions given to you by your surgeon or nurse. Eat a healthy diet and drink plenty of water or other fluids.  If you return to the hospital for any reason within 96 hours following the administration of EXPAREL, it is important for health care providers to know that you have received this anesthetic. A teal colored band has been placed on your arm with the date, time and amount of EXPAREL you have received in order to alert and inform your health care providers. Please leave this armband in place for the full 96 hours following administration, and then you may remove the band.  POLAR CARE INFORMATION  MassAdvertisement.it  How to use [employer] Polar Care [facility] Therapy System?  YouTube   ShippingScam.co.uk  OPERATING INSTRUCTIONS  Start the product With dry hands, connect the transformer to the electrical connection located on the top of the cooler. Next, plug the transformer into an appropriate electrical outlet. The unit will automatically start running at this point.  To stop the pump, disconnect electrical power.  Unplug to stop the product when not in use. Unplugging the Polar Care unit turns it off. Always unplug immediately after use. Never leave it plugged in while unattended. Remove pad.    FIRST ADD WATER TO FILL LINE, THEN ICE---Replace ice when existing ice is almost melted  1 Discuss Treatment with your Licensed Health Care Practitioner and Use Only as Prescribed 2 Apply Insulation  Barrier & Cold Therapy Pad 3 Check for Moisture 4 Inspect Skin Regularly  Tips and Trouble Shooting Usage Tips 1. Use cubed or chunked ice for optimal performance. 2. It is recommended to drain the Pad between uses. To drain the pad, hold the Pad upright  with the hose pointed toward the ground. Depress the black plunger and allow water to drain out. 3. You may disconnect the Pad from the unit without removing the pad from the affected area by depressing the silver tabs on the hose coupling and gently pulling the hoses apart. The Pad and unit will seal itself and will not leak. Note: Some dripping during release is normal. 4. DO NOT RUN PUMP WITHOUT WATER! The pump in this unit is designed to run with water. Running the unit without water will cause permanent damage to the pump. 5. Unplug unit before removing lid.  TROUBLESHOOTING GUIDE Pump not running, Water not flowing to the pad, Pad is not getting cold 1. Make sure the transformer is plugged into the wall outlet. 2. Confirm that the ice and water are filled to the indicated levels. 3. Make sure there are no kinks in the pad. 4. Gently pull on the blue tube to make sure the tube/pad junction is straight. 5. Remove the pad from the treatment site and ll it while the pad is lying at; then reapply. 6. Confirm that the pad couplings are securely attached to the unit. Listen for the double clicks (Figure 1) to confirm the pad couplings are securely attached.  Leaks    Note: Some condensation on the lines, controller, and pads is unavoidable, especially in warmer climates. 1. If using a [employer] Polar Care Cold Therapy unit with a detachable Cold Therapy Pad, and a leak exists (other than condensation on the lines) disconnect the pad couplings. Make sure the silver tabs on the couplings are depressed before reconnecting the pad to the pump hose; then confirm both sides of the coupling are properly clicked in. 2. If the coupling continues to leak or a leak is detected in the pad itself, stop using it and call [employer] Customer Care at [phone].  Cleaning After use, empty and dry the unit with a soft cloth. Warm water and mild detergent may be used occasionally to clean the pump and tubes.  WARNING:  The Polar Care Cube can be cold enough to cause serious injury, including full skin necrosis. Follow these Operating Instructions, and carefully read the Product Insert (see pouch on side of unit) and the Cold Therapy Pad Fitting Instructions (provided with each Cold Therapy Pad) prior to use.              Please contact your physician with any problems or Same Day Surgery at [phone], Monday through Friday 6 am to 4 pm, or  at [facility] number at [phone].

## 2022-11-21 NOTE — Anesthesia Preprocedure Evaluation (Signed)
Anesthesia Evaluation  Patient identified by MRN, date of birth, ID band Patient awake    Reviewed: Allergy & Precautions, H&P , NPO status , Patient's Chart, lab work & pertinent test results, reviewed documented beta blocker date and time   History of Anesthesia Complications (+) PONV and history of anesthetic complications  Airway Mallampati: II  TM Distance: >3 FB Neck ROM: full    Dental  (+) Caps, Dental Advidsory Given, Teeth Intact   Pulmonary neg pulmonary ROS   Pulmonary exam normal breath sounds clear to auscultation       Cardiovascular Exercise Tolerance: Good negative cardio ROS Normal cardiovascular exam Rhythm:regular Rate:Normal     Neuro/Psych  PSYCHIATRIC DISORDERS  Depression    negative neurological ROS     GI/Hepatic negative GI ROS, Neg liver ROS,,,  Endo/Other  negative endocrine ROS    Renal/GU negative Renal ROS  negative genitourinary   Musculoskeletal   Abdominal   Peds  Hematology negative hematology ROS (+)   Anesthesia Other Findings Past Medical History: No date: Anemia     Comment:  as a child No date: Depression No date: Hypercholesterolemia No date: PONV (postoperative nausea and vomiting)     Comment:  x1 with tonsillectomy age 62-no problems since No date: Rotator cuff tear, right   Reproductive/Obstetrics negative OB ROS                             Anesthesia Physical Anesthesia Plan  ASA: 2  Anesthesia Plan: General   Post-op Pain Management:    Induction: Intravenous  PONV Risk Score and Plan: 4 or greater and Propofol infusion, TIVA, Midazolam, Treatment may vary due to age or medical condition and Ondansetron  Airway Management Planned: Oral ETT  Additional Equipment:   Intra-op Plan:   Post-operative Plan: Extubation in OR  Informed Consent: I have reviewed the patients History and Physical, chart, labs and discussed the  procedure including the risks, benefits and alternatives for the proposed anesthesia with the patient or authorized representative who has indicated his/her understanding and acceptance.     Dental Advisory Given  Plan Discussed with: Anesthesiologist, CRNA and Surgeon  Anesthesia Plan Comments:        Anesthesia Quick Evaluation

## 2022-11-22 ENCOUNTER — Encounter: Payer: Self-pay | Admitting: Surgery

## 2022-11-23 NOTE — Anesthesia Postprocedure Evaluation (Signed)
Anesthesia Post Note  Patient: Diana Baldwin  Procedure(s) Performed: RIGHT SHOULDER ARTHROSCOPY WITH DEBRIDEMENT, DECOMPRESSION, ROTATOR CUFF REPAIR, (Right: Shoulder)  Patient location during evaluation: PACU Anesthesia Type: General Level of consciousness: awake and alert Pain management: pain level controlled Vital Signs Assessment: post-procedure vital signs reviewed and stable Respiratory status: spontaneous breathing, nonlabored ventilation, respiratory function stable and patient connected to nasal cannula oxygen Cardiovascular status: blood pressure returned to baseline and stable Postop Assessment: no apparent nausea or vomiting Anesthetic complications: no   No notable events documented.   Last Vitals:  Vitals:   11/21/22 1415 11/21/22 1439  BP: (!) 110/54 (!) 110/56  Pulse: 66 68  Resp: 19 18  Temp: (!) 36.3 C 36.6 C  SpO2: 100% 98%    Last Pain:  Vitals:   11/21/22 1439  TempSrc: Temporal  PainSc:                  Lenard Simmer

## 2023-04-21 ENCOUNTER — Other Ambulatory Visit: Payer: Self-pay | Admitting: Physician Assistant

## 2023-04-21 DIAGNOSIS — R413 Other amnesia: Secondary | ICD-10-CM

## 2023-04-22 ENCOUNTER — Encounter: Payer: Self-pay | Admitting: Physician Assistant

## 2023-04-24 ENCOUNTER — Ambulatory Visit
Admission: RE | Admit: 2023-04-24 | Discharge: 2023-04-24 | Disposition: A | Discharge status: Home / Self Care | Payer: BC Managed Care – PPO | Source: Ambulatory Visit | Attending: Physician Assistant | Admitting: Physician Assistant

## 2023-04-24 DIAGNOSIS — R413 Other amnesia: Secondary | ICD-10-CM

## 2023-12-25 DIAGNOSIS — G4733 Obstructive sleep apnea (adult) (pediatric): Secondary | ICD-10-CM | POA: Diagnosis not present

## 2024-01-24 DIAGNOSIS — G4733 Obstructive sleep apnea (adult) (pediatric): Secondary | ICD-10-CM | POA: Diagnosis not present

## 2024-02-16 DIAGNOSIS — Z0100 Encounter for examination of eyes and vision without abnormal findings: Secondary | ICD-10-CM | POA: Diagnosis not present

## 2024-02-16 DIAGNOSIS — H2513 Age-related nuclear cataract, bilateral: Secondary | ICD-10-CM | POA: Diagnosis not present

## 2024-02-16 DIAGNOSIS — H25042 Posterior subcapsular polar age-related cataract, left eye: Secondary | ICD-10-CM | POA: Diagnosis not present

## 2024-02-23 ENCOUNTER — Encounter: Payer: Self-pay | Admitting: Ophthalmology

## 2024-02-24 ENCOUNTER — Encounter: Payer: Self-pay | Admitting: Ophthalmology

## 2024-02-24 DIAGNOSIS — G4733 Obstructive sleep apnea (adult) (pediatric): Secondary | ICD-10-CM | POA: Diagnosis not present

## 2024-02-24 DIAGNOSIS — G3184 Mild cognitive impairment, so stated: Secondary | ICD-10-CM | POA: Diagnosis not present

## 2024-02-24 NOTE — Anesthesia Preprocedure Evaluation (Signed)
 Anesthesia Evaluation    Airway        Dental   Pulmonary           Cardiovascular      Neuro/Psych    GI/Hepatic   Endo/Other    Renal/GU      Musculoskeletal   Abdominal   Peds  Hematology   Anesthesia Other Findings Had sleep study, don't know results 12-09-23  Depression Hypercholesterolemia Rotator cuff tear, right Anemia PONV (postoperative nausea and vomiting) Mild cognitive impairment  PONV, plan preop zofran   Reproductive/Obstetrics                              Anesthesia Physical Anesthesia Plan Anesthesia Quick Evaluation

## 2024-02-25 DIAGNOSIS — H2512 Age-related nuclear cataract, left eye: Secondary | ICD-10-CM | POA: Diagnosis not present

## 2024-02-25 DIAGNOSIS — H2513 Age-related nuclear cataract, bilateral: Secondary | ICD-10-CM | POA: Diagnosis not present

## 2024-02-25 DIAGNOSIS — H25042 Posterior subcapsular polar age-related cataract, left eye: Secondary | ICD-10-CM | POA: Diagnosis not present

## 2024-03-02 NOTE — Discharge Instructions (Signed)

## 2024-03-04 ENCOUNTER — Ambulatory Visit: Payer: Self-pay | Admitting: Anesthesiology

## 2024-03-04 ENCOUNTER — Encounter
Admission: RE | Disposition: A | Discharge status: Home / Self Care | Payer: Self-pay | Source: Home / Self Care | Attending: Ophthalmology

## 2024-03-04 ENCOUNTER — Other Ambulatory Visit: Payer: Self-pay

## 2024-03-04 ENCOUNTER — Encounter: Payer: Self-pay | Admitting: Ophthalmology

## 2024-03-04 ENCOUNTER — Ambulatory Visit
Admission: RE | Admit: 2024-03-04 | Discharge: 2024-03-04 | Disposition: A | Discharge status: Home / Self Care | Attending: Ophthalmology | Admitting: Ophthalmology

## 2024-03-04 DIAGNOSIS — E7800 Pure hypercholesterolemia, unspecified: Secondary | ICD-10-CM | POA: Diagnosis not present

## 2024-03-04 DIAGNOSIS — H2512 Age-related nuclear cataract, left eye: Secondary | ICD-10-CM | POA: Insufficient documentation

## 2024-03-04 DIAGNOSIS — H25042 Posterior subcapsular polar age-related cataract, left eye: Secondary | ICD-10-CM | POA: Diagnosis not present

## 2024-03-04 HISTORY — PX: CATARACT EXTRACTION W/PHACO: SHX586

## 2024-03-04 HISTORY — DX: Mild cognitive impairment of uncertain or unknown etiology: G31.84

## 2024-03-04 SURGERY — PHACOEMULSIFICATION, CATARACT, WITH IOL INSERTION
Anesthesia: Monitor Anesthesia Care | Site: Eye | Laterality: Left

## 2024-03-04 MED ORDER — ONDANSETRON HCL 4 MG/2ML IJ SOLN
4.0000 mg | Freq: Once | INTRAMUSCULAR | Status: AC
  Administered 2024-03-04: 4 mg via INTRAVENOUS

## 2024-03-04 MED ORDER — TETRACAINE HCL 0.5 % OP SOLN
OPHTHALMIC | Status: AC
  Filled 2024-03-04: qty 4

## 2024-03-04 MED ORDER — ARMC OPHTHALMIC DILATING DROPS
1.0000 | OPHTHALMIC | Status: DC | PRN
  Administered 2024-03-04 (×3): 1 via OPHTHALMIC

## 2024-03-04 MED ORDER — FENTANYL CITRATE (PF) 100 MCG/2ML IJ SOLN
INTRAMUSCULAR | Status: AC
  Filled 2024-03-04: qty 2

## 2024-03-04 MED ORDER — LACTATED RINGERS IV SOLN: INTRAVENOUS | Status: DC

## 2024-03-04 MED ORDER — FENTANYL CITRATE (PF) 100 MCG/2ML IJ SOLN
INTRAMUSCULAR | Status: DC | PRN
  Administered 2024-03-04: 50 ug via INTRAVENOUS

## 2024-03-04 MED ORDER — ONDANSETRON HCL 4 MG/2ML IJ SOLN
INTRAMUSCULAR | Status: AC
  Filled 2024-03-04: qty 2

## 2024-03-04 MED ORDER — BRIMONIDINE TARTRATE-TIMOLOL 0.2-0.5 % OP SOLN
OPHTHALMIC | Status: DC | PRN
  Administered 2024-03-04: 1 [drp] via OPHTHALMIC

## 2024-03-04 MED ORDER — PHENYLEPHRINE-KETOROLAC 1-0.3 % IO SOLN
INTRAOCULAR | Status: DC | PRN
  Administered 2024-03-04: 88 mL via OPHTHALMIC

## 2024-03-04 MED ORDER — MOXIFLOXACIN HCL 0.5 % OP SOLN
OPHTHALMIC | Status: DC | PRN
  Administered 2024-03-04: .2 mL via OPHTHALMIC

## 2024-03-04 MED ORDER — SIGHTPATH DOSE#1 BSS IO SOLN
INTRAOCULAR | Status: DC | PRN
  Administered 2024-03-04: 15 mL via INTRAOCULAR

## 2024-03-04 MED ORDER — MIDAZOLAM HCL 2 MG/2ML IJ SOLN
INTRAMUSCULAR | Status: DC | PRN
  Administered 2024-03-04: 1 mg via INTRAVENOUS

## 2024-03-04 MED ORDER — MIDAZOLAM HCL 2 MG/2ML IJ SOLN
INTRAMUSCULAR | Status: AC
  Filled 2024-03-04: qty 2

## 2024-03-04 MED ORDER — TETRACAINE HCL 0.5 % OP SOLN
1.0000 [drp] | OPHTHALMIC | Status: DC | PRN
  Administered 2024-03-04 (×3): 1 [drp] via OPHTHALMIC

## 2024-03-04 MED ORDER — ARMC OPHTHALMIC DILATING DROPS
OPHTHALMIC | Status: AC
  Filled 2024-03-04: qty 0.5

## 2024-03-04 MED ORDER — SIGHTPATH DOSE#1 NA HYALUR & NA CHOND-NA HYALUR IO KIT
PACK | INTRAOCULAR | Status: DC | PRN
Start: 2024-03-04 — End: 2024-03-04
  Administered 2024-03-04: 1 via OPHTHALMIC

## 2024-03-04 MED ORDER — LIDOCAINE HCL (PF) 2 % IJ SOLN
INTRAOCULAR | Status: DC | PRN
  Administered 2024-03-04: 1 mL via INTRAOCULAR

## 2024-03-04 SURGICAL SUPPLY — 10 items
DISSECTOR HYDRO NUCLEUS 50X22 (MISCELLANEOUS) ×1 IMPLANT
DRSG TEGADERM 2-3/8X2-3/4 SM (GAUZE/BANDAGES/DRESSINGS) ×1 IMPLANT
FEE CATARACT SUITE SIGHTPATH (MISCELLANEOUS) ×1 IMPLANT
GLOVE BIOGEL PI IND STRL 8 (GLOVE) ×1 IMPLANT
GLOVE SURG LX STRL 7.5 STRW (GLOVE) ×1 IMPLANT
GLOVE SURG SYN 6.5 PF PI BL (GLOVE) ×1 IMPLANT
LENS IOL TECNIS MONO 20.0 (Intraocular Lens) IMPLANT
NDL FILTER BLUNT 18X1 1/2 (NEEDLE) ×1 IMPLANT
NEEDLE FILTER BLUNT 18X1 1/2 (NEEDLE) ×1 IMPLANT
SYR 3ML LL SCALE MARK (SYRINGE) ×1 IMPLANT

## 2024-03-04 NOTE — Anesthesia Postprocedure Evaluation (Signed)
 Anesthesia Post Note  Patient: Diana Baldwin  Procedure(s) Performed: PHACOEMULSIFICATION, CATARACT, WITH IOL INSERTION 9.07, 01:00.6 (Left: Eye)  Patient location during evaluation: PACU Anesthesia Type: MAC Level of consciousness: awake and alert Pain management: pain level controlled Vital Signs Assessment: post-procedure vital signs reviewed and stable Respiratory status: spontaneous breathing, nonlabored ventilation, respiratory function stable and patient connected to nasal cannula oxygen Cardiovascular status: stable and blood pressure returned to baseline Postop Assessment: no apparent nausea or vomiting Anesthetic complications: no   No notable events documented.   Last Vitals:  Vitals:   03/04/24 0920 03/04/24 0921  BP:  (!) 141/67  Pulse: 60 (!) 59  Resp: 18 15  Temp:  36.5 C  SpO2: 99% 97%    Last Pain:  Vitals:   03/04/24 0921  TempSrc:   PainSc: 0-No pain                 Donny JAYSON Mu

## 2024-03-04 NOTE — Transfer of Care (Signed)
 Immediate Anesthesia Transfer of Care Note  Patient: Diana Baldwin  Procedure(s) Performed: PHACOEMULSIFICATION, CATARACT, WITH IOL INSERTION 9.07, 01:00.6 (Left: Eye)  Patient Location: PACU  Anesthesia Type: MAC  Level of Consciousness: awake, alert  and patient cooperative  Airway and Oxygen Therapy: Patient Spontanous Breathing and Patient connected to supplemental oxygen  Post-op Assessment: Post-op Vital signs reviewed, Patient's Cardiovascular Status Stable, Respiratory Function Stable, Patent Airway and No signs of Nausea or vomiting  Post-op Vital Signs: Reviewed and stable  Complications: No notable events documented.

## 2024-03-04 NOTE — Op Note (Signed)
 OPERATIVE NOTE  Diana Baldwin [id number] 03/04/2024   PREOPERATIVE DIAGNOSIS: Nuclear sclerotic cataract left eye. H25.12   POSTOPERATIVE DIAGNOSIS: Nuclear sclerotic cataract left eye. H25.12   PROCEDURE:  Phacoemulsification with posterior chamber intraocular lens placement of the left eye  Ultrasound time: Procedure(s): PHACOEMULSIFICATION, CATARACT, WITH IOL INSERTION 9.07, 01:00.6 (Left)  LENS:   Implant Name Type Inv. Item Serial No. Manufacturer Lot No. LRB No. Used Action  LENS IOL TECNIS MONO 20.0 - [id number] Intraocular Lens LENS IOL TECNIS MONO 20.0 [id number] [employer]  Left 1 Implanted      SURGEON:  Feliciano HERO. Enola, MD   ANESTHESIA:  Topical with tetracaine  drops, augmented with 1% preservative-free intracameral lidocaine .   COMPLICATIONS:  None.   DESCRIPTION OF PROCEDURE:  The patient was identified in the holding room and transported to the operating room and placed in the supine position under the operating microscope.  The left eye was identified as the operative eye, which was prepped and draped in the usual sterile ophthalmic fashion.   A 1 millimeter clear-corneal paracentesis was made inferotemporally. Preservative-free 1% lidocaine  mixed with 1:1,000 bisulfite-free aqueous solution of epinephrine  was injected into the anterior chamber. The anterior chamber was then filled with Viscoat viscoelastic. A 2.4 millimeter keratome was used to make a clear-corneal incision superotemporally. A curvilinear capsulorrhexis was made with a cystotome and capsulorrhexis forceps. Balanced salt  solution was used to hydrodissect and hydrodelineate the nucleus. Phacoemulsification was then used to remove the lens nucleus and epinucleus. The remaining cortex was then removed using the irrigation and aspiration handpiece. Provisc was then placed into the capsular bag to distend it for lens placement. A +20.00 D [id number] intraocular lens was then injected into the capsular bag. The  remaining viscoelastic was aspirated.   Wounds were hydrated with balanced salt  solution.  The anterior chamber was inflated to a physiologic pressure with balanced salt  solution.  No wound leaks were noted. Moxifloxacin  was injected intracamerally.  Timolol  and Brimonidine  drops were applied to the eye.  The patient was taken to the recovery room in stable condition without complications of anesthesia or surgery.  [employer] 03/04/2024, 9:15 AM

## 2024-03-04 NOTE — H&P (Signed)
 [facility]   Primary Care Physician:  Jeffie Cheryl BRAVO, MD Ophthalmologist: Dr. Feliciano Ober  Pre-Procedure History & Physical: HPI:  Diana Baldwin is a 63 y.o. female here for cataract surgery.   Past Medical History:  Diagnosis Date   Anemia    as a child   Depression    Hypercholesterolemia    Mild cognitive impairment    PONV (postoperative nausea and vomiting)    x1 with tonsillectomy age 69-no problems since   Rotator cuff tear, right     Past Surgical History:  Procedure Laterality Date   CESAREAN SECTION     CHOLECYSTECTOMY     ENDOMETRIAL ABLATION     SHOULDER ARTHROSCOPY WITH SUBACROMIAL DECOMPRESSION, ROTATOR CUFF REPAIR AND BICEP TENDON REPAIR Right 11/21/2022   Procedure: RIGHT SHOULDER ARTHROSCOPY WITH DEBRIDEMENT, DECOMPRESSION, ROTATOR CUFF REPAIR,;  Surgeon: Edie Norleen PARAS, MD;  Location: [facility] ORS;  Service: Orthopedics;  Laterality: Right;   TONSILLECTOMY     age 62    Prior to Admission medications   Medication Sig Start Date End Date Taking? Authorizing Provider  acetaminophen  (TYLENOL ) 500 MG tablet Take 500 mg by mouth every 6 (six) hours as needed for mild pain.   Yes [provider]  cetirizine (ZYRTEC) 5 MG chewable tablet Chew 5 mg by mouth daily.   Yes [provider]  Cholecalciferol (VITAMIN D) 50 MCG (2000 UT) CAPS Take 1 capsule by mouth daily at 6 (six) AM.   Yes [provider]  Cyanocobalamin (VITAMIN B-12 PO) Take 1 tablet by mouth daily at 6 (six) AM.   Yes [provider]  FLUoxetine (PROZAC) 10 MG capsule Take 10 mg by mouth every morning.   Yes [provider]  fluticasone (FLONASE) 50 MCG/ACT nasal spray Place 2 sprays into both nostrils daily.   Yes [provider]  naphazoline-pheniramine (NAPHCON-A) 0.025-0.3 % ophthalmic solution Place 1 drop into both eyes 4 (four) times daily as needed for eye irritation.   Yes [provider]  Potassium 99 MG TABS Take  1 tablet by mouth daily at 6 (six) AM.   Yes [provider]  rosuvastatin (CRESTOR) 5 MG tablet Take 5 mg by mouth every morning. 01/31/21  Yes [provider]  fexofenadine (ALLEGRA) 180 MG tablet Take 180 mg by mouth every morning. Patient not taking: Reported on 02/23/2024    [provider]  oxyCODONE  (ROXICODONE ) 5 MG immediate release tablet Take 1-2 tablets (5-10 mg total) by mouth every 4 (four) hours as needed for moderate pain or severe pain. Patient not taking: Reported on 02/23/2024 11/21/22   Poggi, Norleen PARAS, MD    Allergies as of 02/19/2024   (No Known Allergies)    Family History  Problem Relation Age of Onset   Cancer Mother    Alzheimer's disease Mother    Transient ischemic attack Mother    Cancer Father    Alzheimer's disease Father     Social History   Socioeconomic History   Marital status: Divorced    Spouse name: Not on file   Number of children: Not on file   Years of education: Not on file   Highest education level: Not on file  Occupational History   Not on file  Tobacco Use   Smoking status: Never   Smokeless tobacco: Never  Vaping Use   Vaping status: Never Used  Substance and Sexual Activity   Alcohol use: Yes    Comment: social   Drug  use: No   Sexual activity: Not on file  Other Topics Concern   Not on file  Social History Narrative   Not on file   Social Drivers of Health   Financial Resource Strain: Not on file  Food Insecurity: Not on file  Transportation Needs: Not on file  Physical Activity: Not on file  Stress: Not on file  Social Connections: Not on file  Intimate Partner Violence: Not on file    Review of Systems: See HPI, otherwise negative ROS  Physical Exam: Ht 5' 8 (1.727 m)   Wt 78.9 kg   BMI 26.46 kg/m  General:   Alert, cooperative in NAD Head:  Normocephalic and atraumatic. Respiratory:  Normal work of breathing. Cardiovascular:  RRR  Impression/Plan: Diana Baldwin is  here for cataract surgery.  Risks, benefits, limitations, and alternatives regarding cataract surgery have been reviewed with the patient.  Questions have been answered.  All parties agreeable.   Feliciano Bryan Ober, MD  03/04/2024, 7:09 AM

## 2024-03-11 DIAGNOSIS — R0683 Snoring: Secondary | ICD-10-CM | POA: Diagnosis not present

## 2024-03-11 DIAGNOSIS — G479 Sleep disorder, unspecified: Secondary | ICD-10-CM | POA: Diagnosis not present

## 2024-03-11 DIAGNOSIS — G3184 Mild cognitive impairment, so stated: Secondary | ICD-10-CM | POA: Diagnosis not present

## 2024-03-11 DIAGNOSIS — Z820 Family history of epilepsy and other diseases of the nervous system: Secondary | ICD-10-CM | POA: Diagnosis not present

## 2024-03-25 DIAGNOSIS — J301 Allergic rhinitis due to pollen: Secondary | ICD-10-CM | POA: Diagnosis not present

## 2024-03-25 DIAGNOSIS — Z820 Family history of epilepsy and other diseases of the nervous system: Secondary | ICD-10-CM | POA: Diagnosis not present

## 2024-03-25 DIAGNOSIS — R7302 Impaired glucose tolerance (oral): Secondary | ICD-10-CM | POA: Diagnosis not present

## 2024-03-25 DIAGNOSIS — F0280 Dementia in other diseases classified elsewhere without behavioral disturbance: Secondary | ICD-10-CM | POA: Diagnosis not present

## 2024-03-25 DIAGNOSIS — G300 Alzheimer's disease with early onset: Secondary | ICD-10-CM | POA: Diagnosis not present

## 2024-03-25 DIAGNOSIS — J302 Other seasonal allergic rhinitis: Secondary | ICD-10-CM | POA: Diagnosis not present

## 2024-03-25 DIAGNOSIS — E7800 Pure hypercholesterolemia, unspecified: Secondary | ICD-10-CM | POA: Diagnosis not present

## 2024-03-25 DIAGNOSIS — G4733 Obstructive sleep apnea (adult) (pediatric): Secondary | ICD-10-CM | POA: Diagnosis not present

## 2024-03-25 DIAGNOSIS — G3184 Mild cognitive impairment, so stated: Secondary | ICD-10-CM | POA: Diagnosis not present

## 2024-03-25 DIAGNOSIS — E538 Deficiency of other specified B group vitamins: Secondary | ICD-10-CM | POA: Diagnosis not present

## 2024-03-25 DIAGNOSIS — F3341 Major depressive disorder, recurrent, in partial remission: Secondary | ICD-10-CM | POA: Diagnosis not present

## 2024-03-25 DIAGNOSIS — Z0000 Encounter for general adult medical examination without abnormal findings: Secondary | ICD-10-CM | POA: Diagnosis not present

## 2024-03-26 DIAGNOSIS — G4733 Obstructive sleep apnea (adult) (pediatric): Secondary | ICD-10-CM | POA: Diagnosis not present

## 2024-04-08 DIAGNOSIS — G3184 Mild cognitive impairment, so stated: Secondary | ICD-10-CM | POA: Diagnosis not present

## 2024-04-15 DIAGNOSIS — G4733 Obstructive sleep apnea (adult) (pediatric): Secondary | ICD-10-CM | POA: Diagnosis not present

## 2024-04-25 DIAGNOSIS — G4733 Obstructive sleep apnea (adult) (pediatric): Secondary | ICD-10-CM | POA: Diagnosis not present

## 2024-05-26 DIAGNOSIS — G4733 Obstructive sleep apnea (adult) (pediatric): Secondary | ICD-10-CM | POA: Diagnosis not present

## 2024-05-27 DIAGNOSIS — G3184 Mild cognitive impairment, so stated: Secondary | ICD-10-CM | POA: Diagnosis not present

## 2024-05-27 DIAGNOSIS — G4750 Parasomnia, unspecified: Secondary | ICD-10-CM | POA: Diagnosis not present

## 2024-05-27 DIAGNOSIS — F0280 Dementia in other diseases classified elsewhere without behavioral disturbance: Secondary | ICD-10-CM | POA: Diagnosis not present

## 2024-05-27 DIAGNOSIS — G300 Alzheimer's disease with early onset: Secondary | ICD-10-CM | POA: Diagnosis not present

## 2024-05-27 DIAGNOSIS — Z820 Family history of epilepsy and other diseases of the nervous system: Secondary | ICD-10-CM | POA: Diagnosis not present

## 2024-05-27 DIAGNOSIS — G4733 Obstructive sleep apnea (adult) (pediatric): Secondary | ICD-10-CM | POA: Diagnosis not present
